# Patient Record
Sex: Female | Born: 2005 | Race: Black or African American | Hispanic: No | Marital: Single | Smoking: Never smoker
Health system: Southern US, Community
[De-identification: ages and names within clinical notes are randomized; demographics above are authoritative.]

## PROBLEM LIST (undated history)

## (undated) DIAGNOSIS — J309 Allergic rhinitis, unspecified: Secondary | ICD-10-CM

## (undated) DIAGNOSIS — E669 Obesity, unspecified: Secondary | ICD-10-CM

## (undated) DIAGNOSIS — I10 Essential (primary) hypertension: Secondary | ICD-10-CM

## (undated) DIAGNOSIS — J45909 Unspecified asthma, uncomplicated: Principal | ICD-10-CM

## (undated) HISTORY — DX: Obesity, unspecified: E66.9

## (undated) HISTORY — PX: TONSILLECTOMY: SUR1361

## (undated) HISTORY — DX: Unspecified asthma, uncomplicated: J45.909

## (undated) HISTORY — PX: TYMPANOSTOMY TUBE PLACEMENT: SHX32

## (undated) HISTORY — DX: Essential (primary) hypertension: I10

## (undated) HISTORY — DX: Allergic rhinitis, unspecified: J30.9

---
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
## 2023-01-24 ENCOUNTER — Ambulatory Visit (INDEPENDENT_AMBULATORY_CARE_PROVIDER_SITE_OTHER): Payer: Self-pay | Admitting: Pediatric Endocrinology

## 2023-01-30 ENCOUNTER — Other Ambulatory Visit (INDEPENDENT_AMBULATORY_CARE_PROVIDER_SITE_OTHER): Payer: Self-pay | Admitting: Pediatric Endocrinology

## 2023-02-04 ENCOUNTER — Emergency Department (HOSPITAL_COMMUNITY)
Admission: EM | Admit: 2023-02-04 | Discharge: 2023-02-04 | Disposition: A | Discharge status: Home / Self Care | Payer: Medicaid Other | Attending: Emergency Medicine | Admitting: Emergency Medicine

## 2023-02-04 ENCOUNTER — Emergency Department (HOSPITAL_COMMUNITY): Payer: Medicaid Other

## 2023-02-04 ENCOUNTER — Other Ambulatory Visit: Payer: Self-pay

## 2023-02-04 DIAGNOSIS — S61211A Laceration without foreign body of left index finger without damage to nail, initial encounter: Secondary | ICD-10-CM | POA: Diagnosis present

## 2023-02-04 DIAGNOSIS — E119 Type 2 diabetes mellitus without complications: Secondary | ICD-10-CM | POA: Insufficient documentation

## 2023-02-04 DIAGNOSIS — Z7984 Long term (current) use of oral hypoglycemic drugs: Secondary | ICD-10-CM | POA: Insufficient documentation

## 2023-02-04 DIAGNOSIS — W268XXA Contact with other sharp object(s), not elsewhere classified, initial encounter: Secondary | ICD-10-CM | POA: Diagnosis not present

## 2023-02-04 DIAGNOSIS — I10 Essential (primary) hypertension: Secondary | ICD-10-CM | POA: Insufficient documentation

## 2023-02-04 DIAGNOSIS — Z794 Long term (current) use of insulin: Secondary | ICD-10-CM | POA: Diagnosis not present

## 2023-02-04 DIAGNOSIS — J45909 Unspecified asthma, uncomplicated: Secondary | ICD-10-CM | POA: Insufficient documentation

## 2023-02-04 DIAGNOSIS — Z23 Encounter for immunization: Secondary | ICD-10-CM | POA: Insufficient documentation

## 2023-02-04 MED ORDER — TETANUS-DIPHTH-ACELL PERTUSSIS 5-2.5-18.5 LF-MCG/0.5 IM SUSY
0.5000 mL | PREFILLED_SYRINGE | Freq: Once | INTRAMUSCULAR | Status: AC
  Administered 2023-02-04: 0.5 mL via INTRAMUSCULAR
  Filled 2023-02-04: qty 0.5

## 2023-02-04 NOTE — ED Triage Notes (Signed)
Pt has a laceration on left hand , first finger. Bleeding under control. Pt was attempting to use a razor blade and it slipped.

## 2023-02-04 NOTE — ED Provider Notes (Signed)
[facility] Provider Note   CSN: [id number] Arrival date & time: 02/04/23  1038     History  Chief Complaint  Patient presents with   Laceration    Tanya Johnson is a 17 y.o. female.   Laceration   17 year old female presents emergency department with complaints of laceration.  Patient states that she recently opened a package that was shipped to her with blades to cut off excess dead skin and when opening a package, suffered a cut to her left index finger.  Denies any weakness or sensory deficits distally.  Is unaware of last tetanus shot but states she had complete series of childhood vaccines.  Past medical history significant for hypertension, obesity, asthma, diabetes mellitus, PCOS  Home Medications Prior to Admission medications   Medication Sig Start Date End Date Taking? Authorizing Provider  acetone, urine, test strip Check ketones per protocol Patient not taking: Reported on 07/22/2020 03/05/20   Lelon Huh, MD  BD PEN NEEDLE MICRO U/F 32G X 6 MM MISC USE TO IINJECT INUSLIN 6 TIMES DAILY. 05/02/22   Lelon Huh, MD  BD PEN NEEDLE NANO 2ND GEN 32G X 4 MM MISC USE TO INJECT INSULIN 6 TIMES DAILY 04/26/21   Lelon Huh, MD  Continuous Blood Gluc Receiver ([place]) DEVI Use with Dexcom Sensor and Transmitter to check Blood Sugars Patient not taking: Reported on 02/21/2021 08/18/20   Lelon Huh, MD  Continuous Blood Gluc Sensor (DEXCOM G6 SENSOR) MISC Remember to change sensor every 10 days. 08/15/21   Lelon Huh, MD  Continuous Blood Gluc Transmit (DEXCOM G6 TRANSMITTER) MISC Use with Dexcom Sensor, reuse transmitter for 3 months. 08/15/21   Lelon Huh, MD  Dulaglutide (TRULICITY) [id number] SOPN Inject 0.75 mg into the skin once a week. Patient not taking: Reported on 10/28/2021 08/15/21   Lelon Huh, MD  fluticasone [facility]) 50 MCG/ACT nasal spray Place 1 spray into both nostrils  daily. Patient not taking: Reported on 07/22/2020 03/25/20   Kyra Leyland, MD  Glucagon [facility] TWO PACK) 3 MG/DOSE POWD Place 1 each into the nose as needed (severe hypoglycmia with unresponsiveness). Patient not taking: Reported on 04/26/2021 03/05/20   Lelon Huh, MD  glucose blood (ACCU-CHEK GUIDE) test strip USE TO CHECK BLOOD SUGAR SIX TIMES DAILY 01/30/23   Lelon Huh, MD  Glucose Blood (IGLUCOSE TEST STRIPS VI) USE AS INSTRUCTED FOR 6 CHECKS DAILY PER PROTOCOL FOR HYPER/HYPOGLYCEMIA 06/18/20   [provider]  insulin aspart (NOVOLOG) 100 UNIT/ML injection Inject up to 200 units into insulin pump as directed every 2-3 days. Please fill for VIAL. Patient not taking: Reported on 10/28/2021 08/29/21   Lelon Huh, MD  Insulin Aspart FlexPen (NOVOLOG) 100 UNIT/ML ADMINISTER UP TO 100 UNITS UNDER THE SKIN EVERY DAY AS DIRECTED BY PRESCRIBER 11/24/22   Lelon Huh, MD  insulin degludec (TRESIBA FLEXTOUCH) 100 UNIT/ML FlexTouch Pen ADMINISTER UP TO 50 UNITS UNDER THE SKIN DAILY AS DIRECTED 10/16/22   Lelon Huh, MD  Insulin Disposable Pump (OMNIPOD 5 G6 INTRO, GEN 5,) KIT Inject 1 Device into the skin as directed. Change pod every 2 days. This will be a 30 day supply. Please fill for [facility] [id number] Patient not taking: Reported on 10/28/2021 08/29/21   Lelon Huh, MD  Insulin Disposable Pump (OMNIPOD 5 G6 POD, GEN 5,) MISC Inject 1 Device into the skin as directed. Change pod every 2 days. Patient will need 3 boxes (each contain 5 pods) for  a 30 day supply. Please fill for [facility] [id number]. 10/28/21   Lelon Huh, MD  insulin glargine (LANTUS SOLOSTAR) 100 UNIT/ML Solostar Pen Inject up to 50 units daily per provider instructions 10/28/21   Lelon Huh, MD  Lancets Misc. ([place]) KIT  03/05/20   [provider]  lisinopril (ZESTRIL) 20 MG tablet Take 1 tablet by mouth daily. 02/27/21   [provider]  loratadine (CLARITIN)  10 MG tablet Take 1 tablet (10 mg total) by mouth daily. Patient not taking: Reported on 07/22/2020 03/25/20   Kyra Leyland, MD  metFORMIN (GLUCOPHAGE-XR) 500 MG 24 hr tablet TAKE 1 TABLET(500 MG) BY MOUTH IN THE MORNING AND AT BEDTIME 09/02/21   Lelon Huh, MD  metFORMIN (GLUCOPHAGE-XR) 750 MG 24 hr tablet Take 750 mg by mouth daily. 10/21/18   [provider]  montelukast (SINGULAIR) 4 MG chewable tablet Chew 1 tablet (4 mg total) by mouth at bedtime. Patient not taking: Reported on 07/22/2020 03/25/20   Kyra Leyland, MD  Ostomy Supplies (SKIN TAC ADHESIVE BARRIER WIPE) MISC For use with Dexcom 02/21/21   Lelon Huh, MD      Allergies    Patient has no known allergies.    Review of Systems   Review of Systems  All other systems reviewed and are negative.   Physical Exam Updated Vital Signs BP (!) 149/78   Pulse 99   Temp 98.6 F (37 C) (Oral)   Resp 17   Ht '5\' 3"'$  (1.6 m)   Wt (!) 141.5 kg   SpO2 100%   BMI 55.27 kg/m  Physical Exam Vitals and nursing note reviewed.  Constitutional:      General: She is not in acute distress.    Appearance: She is well-developed.  HENT:     Head: Normocephalic and atraumatic.  Eyes:     Conjunctiva/sclera: Conjunctivae normal.  Cardiovascular:     Rate and Rhythm: Normal rate and regular rhythm.     Heart sounds: No murmur heard. Pulmonary:     Effort: Pulmonary effort is normal. No respiratory distress.     Breath sounds: Normal breath sounds.  Abdominal:     Palpations: Abdomen is soft.     Tenderness: There is no abdominal tenderness.  Musculoskeletal:        General: No swelling.     Cervical back: Neck supple.     Comments: Patient with full range of motion of first digit.  Approximately 3 cm linear laceration noted on palmar aspect of left index finger crossing distal and phalangeal joint.  Area very superficial in nature.  Active trickling bleeding appreciated when depth of wound observed but able to  tamponade with pressure.  No obvious foreign body appreciated.  Patient complaining no sensory deficit distally.  Patient with full range of motion of said digit in flexion and extension.  Capillary refill less than 2 seconds.  Skin:    General: Skin is warm and dry.     Capillary Refill: Capillary refill takes less than 2 seconds.  Neurological:     Mental Status: She is alert.  Psychiatric:        Mood and Affect: Mood normal.     ED Results / Procedures / Treatments   Labs (all labs ordered are listed, but only abnormal results are displayed) Labs Reviewed - No data to display  EKG None  Radiology DG Hand Complete Left  Result Date: 02/04/2023 CLINICAL DATA:  Left index finger laceration  EXAM: LEFT HAND - COMPLETE 3+ VIEW COMPARISON:  None Available. FINDINGS: There is no evidence of fracture or dislocation. There is no evidence of arthropathy or other focal bone abnormality. Soft tissue swelling and irregularity of the mid to distal aspect of the left index finger with overlying bandaging material. No radiopaque foreign body is seen within the soft tissues. IMPRESSION: Left index finger laceration. No fracture or radiopaque foreign body. Electronically Signed   By: Davina Poke D.O.   On: 02/04/2023 11:08    Procedures Procedures    Medications Ordered in ED Medications  Tdap (BOOSTRIX) injection 0.5 mL (0.5 mLs Intramuscular Given 02/04/23 1126)    ED Course/ Medical Decision Making/ A&P                             Medical Decision Making Amount and/or Complexity of Data Reviewed Radiology: ordered.   This patient presents to the ED for concern of laceration, this involves an extensive number of treatment options, and is a complaint that carries with it a high risk of complications and morbidity.  The differential diagnosis includes laceration, ligamentous/tendinous injury, neurovascular compromise, foreign body retainment   Co morbidities that complicate the  patient evaluation  See HPI   Additional history obtained:  Additional history obtained from EMR External records from outside source obtained and reviewed including hospital records   Lab Tests:  N/a   Imaging Studies ordered:  I ordered imaging studies including left hand x-ray I independently visualized and interpreted imaging which showed laceration appreciated with no obvious foreign body I agree with the radiologist interpretation   Cardiac Monitoring: / EKG:  The patient was maintained on a cardiac monitor.  I personally viewed and interpreted the cardiac monitored which showed an underlying rhythm of: Sinus rhythm   Consultations Obtained:  N/a   Problem List / ED Course / Critical interventions / Medication management  Laceration Reevaluation of the patient showed that the patient stayed the same I have reviewed the patients home medicines and have made adjustments as needed   Social Determinants of Health:  Denies tobacco, illicit drug use.   Test / Admission - Considered:  Laceration Vitals signs significant for mild hypertension with blood pressure 149/78.  Recommend follow-up with primary care regarding elevation blood pressure.. Otherwise within normal range and stable throughout visit. Imaging studies significant for: See above Patient with evidence of left index finger superficial laceration.  Area cleansed and repaired using Dermabond by nursing staff; see their note for more details.  Area well-approximated.  Splint placed afterward.  Patient placed in splint for added protection as well as resistance of flexion of joint given superficial laceration crosses distal interphalangeal joint of affected finger.  No clinical indication for antibiotics at this time.  Patient's tetanus updated while in the emergency department.  Recommend follow-up with primary care for reassessment of symptoms.  Proper wound care with tissue adhesive discussed at length.   Treatment plan discussed at length with patient and she acknowledged understanding was agreeable to said plan. Worrisome signs and symptoms were discussed with the patient, and the patient acknowledged understanding to return to the ED if noticed. Patient was stable upon discharge.          Final Clinical Impression(s) / ED Diagnoses Final diagnoses:  Laceration of left index finger without foreign body without damage to nail, initial encounter    Rx / DC Orders ED Discharge Orders  None         Wilnette Kales, [place] 02/04/23 1129    Noemi Chapel, MD 02/04/23 [phone]

## 2023-02-04 NOTE — Discharge Instructions (Addendum)
Note that your visit to the emergency department today was overall reassuring.  Wound was repaired using tissue adhesive.  Make sure to keep the area free from excess moisture to avoid breakdown of tissue adhesive.  See information attached your discharge papers.  Wear splint as needed for protection as well as to avoid bending affected joint for the next 7 to 10 days.  Recommend reevaluation by primary care for reassessment of your symptoms.  Please do not hesitate to return to emergency department if the worrisome signs and symptoms we discussed become apparent.

## 2023-03-08 ENCOUNTER — Other Ambulatory Visit (INDEPENDENT_AMBULATORY_CARE_PROVIDER_SITE_OTHER): Payer: Self-pay | Admitting: Pediatric Endocrinology

## 2023-03-26 ENCOUNTER — Encounter (INDEPENDENT_AMBULATORY_CARE_PROVIDER_SITE_OTHER): Payer: Self-pay | Admitting: Pediatric Endocrinology

## 2023-03-26 ENCOUNTER — Telehealth (INDEPENDENT_AMBULATORY_CARE_PROVIDER_SITE_OTHER): Payer: Medicaid Other | Admitting: Pediatric Endocrinology

## 2023-03-26 VITALS — Ht 63.0 in | Wt 316.0 lb

## 2023-03-26 DIAGNOSIS — E1165 Type 2 diabetes mellitus with hyperglycemia: Secondary | ICD-10-CM

## 2023-03-26 DIAGNOSIS — E1065 Type 1 diabetes mellitus with hyperglycemia: Secondary | ICD-10-CM | POA: Diagnosis not present

## 2023-03-26 MED ORDER — ACCU-CHEK FASTCLIX LANCET KIT: PACK | 1 refills | Status: AC

## 2023-03-26 MED ORDER — ACCU-CHEK FASTCLIX LANCETS MISC: 3 refills | Status: DC

## 2023-03-26 NOTE — Patient Instructions (Signed)
Next Monday- IN PERSON with KELLY to get your G7 sample. Please have labs drawn when you are in clinic.   2-3 weeks with Dr. Ladona Ridgel to review the [facility] and do the PA. - Virtual   6 weeks with Dr. Vanessa Alorton- Virtual

## 2023-03-26 NOTE — Progress Notes (Signed)
This is a Pediatric Specialist E-Visit consult/follow up provided via My Chart Video Visit (Caregility). Tanya Johnson   consented to an E-Visit consult today.  Is the patient present for the video visit? Yes Location of patient: Tanya Johnson is at home in [place], [place] Is the patient located in the state of [place]? Yes If not in the state of [place], is the location temporary? Ex. vacation or at college? Not Applicable Location of provider: Dessa Phi, MD is at  [facility] Patient was referred by Richrd Sox, MD   The following participants were involved in this E-Visit: Leanna Sato, pt, Danella Maiers, CMA, Dessa Phi, MD  This visit was done via VIDEO   Chief Complain/ Reason for E-Visit today: diabetes Total time on call: 32 min Follow up: 2-3 weeks with Dr. Ladona Ridgel to review the [facility] and do the PA. - Virtual   6 weeks with Dr. Vanessa [place]- Virtual     Subjective:  Subjective  Patient Name: Tanya Johnson Date of Birth: 2006-06-29  MRN: [id number]  Tanya Johnson  presents to the office today for follow-up evaluation and management of her type 1 diabetes and severe pediatric obesity.   HISTORY OF PRESENT ILLNESS:   Tanya Johnson is a 17 y.o. AA female   Tanya Johnson was accompanied by her mom  1. Tanya Johnson was previously followed in pediatric endocrine clinic. She was lost to follow up from spring 2020 to to spring 2021 due to the Covid 19 pandemic. During that year she developed overt diabetes. She was admitted to [facility] on 03/04/20 for initiation of subcutaneous insulin. 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   2. The patient's last [facility] visit was on 08/15/21. In the interim she has been doing ok.   She wishes that she was on her OmniPod but she has missed too many appointments. She is checking her sugar about 1-2 times a day. She is taking insulin after she eats.   She is taking "no more than  10" units at a meal. She is adding up her carbs and using her chart to calculate doses.   She would like to go back onto the [facility]. She was previously on G6 and would like to upgrade to G7.    Insulin:  Tresiba 26 units Novolog 120/30/5 Metformin 1000 mg twice a day- has not been taking this. Does not feel that she needs this.   She feels that her weight is dropping "slowly but surely"   3. Pertinent Review of Systems:  Constitutional: The patient feels "good". The patient seems healthy and active. Eyes: Vision seems to be good. There are no recognized eye problems.  Last eye exam November 2023- got new glasses.  Neck: The patient has no complaints of anterior neck swelling, soreness, tenderness, pressure, discomfort, or difficulty swallowing.   Heart: Heart rate increases with exercise or other physical activity. The patient has no complaints of palpitations, irregular heart beats, chest pain, or chest pressure.   Lungs: Started on Montelukast for her asthma. Last bad asthma when "younger" Gastrointestinal: Bowel movents seem normal. The patient has no complaints of excessive hunger, acid reflux, upset stomach, stomach aches or pains, diarrhea, or constipation.  Legs: Muscle mass and strength seem normal. There are no complaints of numbness, tingling, burning, or pain. No edema is noted.  Feet: There are no obvious foot problems. There are no complaints of numbness, tingling, burning, or pain. No edema is noted. Neurologic: There  are no recognized problems with muscle movement and strength, sensation, or coordination. GYN/GU:  LMP 4/13   Diabetes Alert: none  Annual labs due May 2022 - had BMP at [facility] in April. - WILL DO NEXT WEEK Hypoglycemia- No severe lows recently.  Blood sugar download:  7 day avg 224. 14 day avg 263 (13 checks over 14 days).     PAST MEDICAL, FAMILY, AND SOCIAL HISTORY  Past Medical History:  Diagnosis Date   Abnormal food appetite 02/21/2017    Allergic rhinitis 09/05/2013   Elevated transaminase level 03/04/2020   Hypertension    Obesity, unspecified 09/05/2013   Prediabetes 03/27/2014   Hgb A1C 5.9%     Unspecified asthma(493.90) 09/05/2013    Family History  Problem Relation Age of Onset   Diabetes Mother    Hypertension Mother    Asthma Sister    Obesity Paternal Aunt    Obesity Paternal Grandmother    Diabetes Paternal Grandmother    Hypertension Paternal Grandmother    Diabetes Paternal Grandfather    Heart disease Paternal Grandfather    Hypertension Paternal Grandfather    Obesity Paternal Grandfather    Bronchitis Sister      Current Outpatient Medications:    Accu-Chek FastClix Lancets MISC, Check sugar up to 6 times daily. For use with FAST CLIX Lancet Device, Disp: 204 each, Rfl: 3   acetone, urine, test strip, Check ketones per protocol, Disp: 50 each, Rfl: 3   BD PEN NEEDLE MICRO U/F 32G X 6 MM MISC, USE TO IINJECT INUSLIN 6 TIMES DAILY., Disp: 200 each, Rfl: 5   BD PEN NEEDLE NANO 2ND GEN 32G X 4 MM MISC, USE TO INJECT INSULIN 6 TIMES DAILY, Disp: 200 each, Rfl: 5   glucose blood (ACCU-CHEK GUIDE) test strip, USE TO CHECK BLOOD SUGAR SIX TIMES DAILY, Disp: 200 strip, Rfl: 5   Glucose Blood (IGLUCOSE TEST STRIPS VI), USE AS INSTRUCTED FOR 6 CHECKS DAILY PER PROTOCOL FOR HYPER/HYPOGLYCEMIA, Disp: , Rfl:    Insulin Aspart FlexPen (NOVOLOG) 100 UNIT/ML, ADMINISTER UP TO 100 UNITS UNDER THE SKIN EVERY DAY AS DIRECTED BY PRESCRIBER, Disp: 30 mL, Rfl: 5   insulin degludec (TRESIBA FLEXTOUCH) 100 UNIT/ML FlexTouch Pen, ADMINISTER UP TO 50 UNITS UNDER THE SKIN DAILY AS DIRECTED, Disp: 15 mL, Rfl: 5   Lancets Misc. (ACCU-CHEK FASTCLIX LANCET) KIT, Check sugar 6 times daily, Disp: 1 kit, Rfl: 1   lisinopril (ZESTRIL) 20 MG tablet, Take 1 tablet by mouth daily., Disp: , Rfl:    Continuous Blood Gluc Receiver (DEXCOM G6 RECEIVER) DEVI, Use with Dexcom Sensor and Transmitter to check Blood Sugars (Patient not taking:  Reported on 02/21/2021), Disp: 1 each, Rfl: 0   Continuous Blood Gluc Sensor (DEXCOM G6 SENSOR) MISC, Remember to change sensor every 10 days. (Patient not taking: Reported on 03/26/2023), Disp: 3 each, Rfl: 5   Continuous Blood Gluc Transmit (DEXCOM G6 TRANSMITTER) MISC, Use with Dexcom Sensor, reuse transmitter for 3 months. (Patient not taking: Reported on 03/26/2023), Disp: 1 each, Rfl: 1   Dulaglutide (TRULICITY) 0.75 MG/0.5ML SOPN, Inject 0.75 mg into the skin once a week. (Patient not taking: Reported on 10/28/2021), Disp: 2 mL, Rfl: 3   fluticasone (FLONASE) 50 MCG/ACT nasal spray, Place 1 spray into both nostrils daily. (Patient not taking: Reported on 07/22/2020), Disp: 16 g, Rfl: 12   Glucagon (BAQSIMI TWO PACK) 3 MG/DOSE POWD, Place 1 each into the nose as needed (severe hypoglycmia with unresponsiveness). (Patient not taking: Reported  on 04/26/2021), Disp: 1 each, Rfl: 3   insulin aspart (NOVOLOG) 100 UNIT/ML injection, Inject up to 200 units into insulin pump as directed every 2-3 days. Please fill for VIAL. (Patient not taking: Reported on 10/28/2021), Disp: 30 mL, Rfl: 5   Insulin Disposable Pump (OMNIPOD 5 G6 INTRO, GEN 5,) KIT, Inject 1 Device into the skin as directed. Change pod every 2 days. This will be a 30 day supply. Please fill for [facility] [id number] (Patient not taking: Reported on 10/28/2021), Disp: 1 kit, Rfl: 1   Insulin Disposable Pump (OMNIPOD 5 G6 POD, GEN 5,) MISC, Inject 1 Device into the skin as directed. Change pod every 2 days. Patient will need 3 boxes (each contain 5 pods) for a 30 day supply. Please fill for [facility] [address]-21. (Patient not taking: Reported on 03/26/2023), Disp: 15 each, Rfl: 4   insulin glargine (LANTUS SOLOSTAR) 100 UNIT/ML Solostar Pen, Inject up to 50 units daily per provider instructions (Patient not taking: Reported on 03/26/2023), Disp: 15 mL, Rfl: 6   lisinopril (ZESTRIL) 20 MG tablet, Take by mouth., Disp: , Rfl:    loratadine (CLARITIN) 10 MG tablet,  Take 1 tablet (10 mg total) by mouth daily. (Patient not taking: Reported on 07/22/2020), Disp: 30 tablet, Rfl: 11   metFORMIN (GLUCOPHAGE-XR) 500 MG 24 hr tablet, TAKE 1 TABLET(500 MG) BY MOUTH IN THE MORNING AND AT BEDTIME (Patient not taking: Reported on 03/26/2023), Disp: 60 tablet, Rfl: 5   metFORMIN (GLUCOPHAGE-XR) 750 MG 24 hr tablet, Take 750 mg by mouth daily. (Patient not taking: Reported on 03/26/2023), Disp: , Rfl:    montelukast (SINGULAIR) 4 MG chewable tablet, Chew 1 tablet (4 mg total) by mouth at bedtime. (Patient not taking: Reported on 07/22/2020), Disp: 30 tablet, Rfl: 6   Ostomy Supplies (SKIN TAC ADHESIVE BARRIER WIPE) MISC, For use with Dexcom (Patient not taking: Reported on 03/26/2023), Disp: 50 each, Rfl: 1  Allergies as of 03/26/2023   (No Known Allergies)     reports that she has never smoked. She has been exposed to tobacco smoke. She has never used smokeless tobacco. She reports that she does not currently use drugs after having used the following drugs: Marijuana. She reports that she does not drink alcohol. Pediatric History  Patient Parents   Baswell,Junious (Father)   Sport and exercise psychologist (Mother)   Other Topics Concern   Not on file  Social History Narrative   Lives with dad, PGF and PGM and Paternal Aunt, brother age 15 year old   Goes to [employer] on weekends. Sisters 3 and 2 live with mom      2018 lives with mom and stepdad,and siblings  mom smokes   Visits dad on weekends.      She is in 9th grade       she gets A's and B's. She enjoys playing outside, listening to music, and drawing.       2021: stays with grandmother most of the time, but also sometimes stays with mom      2022: 10th grade at [employer].       2024[employer] 12th grade 23-24 school year    1. School and Family: 12th grade at [employer] 2. Activities:  [employer] 3. Primary Care Provider: Lucio Edward, MD  ROS: There are no other significant problems  involving Kadian's other body systems.    Objective:  Objective  Vital Signs:   Ht 5\' 3"  (1.6 m) Comment: at heart  dr February 12 2023  Wt (!) 316 lb (143.3 kg) Comment: at heart dr February 12 2023  BMI 55.98 kg/m   Blood pressure %iles are not available for patients who are 18 years or older.  Ht Readings from Last 3 Encounters:  03/27/23 5\' 3"  (1.6 m) (31 %, Z= -0.50)*  03/26/23 5\' 3"  (1.6 m) (31 %, Z= -0.50)*  02/04/23 5\' 3"  (1.6 m) (31 %, Z= -0.49)*   * Growth percentiles are based on CDC (Girls, 2-20 Years) data.   Wt Readings from Last 3 Encounters:  04/02/23 (!) 316 lb 12.8 oz (143.7 kg) (>99 %, Z= 2.83)*  03/27/23 (!) 314 lb (142.4 kg) (>99 %, Z= 2.81)*  03/26/23 (!) 316 lb (143.3 kg) (>99 %, Z= 2.82)*   * Growth percentiles are based on CDC (Girls, 2-20 Years) data.   HC Readings from Last 3 Encounters:  No data found for [facility]   Body surface area is 2.52 meters squared. 31 %ile (Z= -0.50) based on CDC (Girls, 2-20 Years) Stature-for-age data based on Stature recorded on 03/26/2023. >99 %ile (Z= 2.82) based on CDC (Girls, 2-20 Years) weight-for-age data using vitals from 03/26/2023.  PHYSICAL EXAM:  Physical Exam Constitutional:      Appearance: Normal appearance. She is obese.  HENT:     Head: Normocephalic.     Right Ear: External ear normal.     Left Ear: External ear normal.     Nose: Nose normal.     Comments: Bilateral piercings    Mouth/Throat:     Mouth: Mucous membranes are moist.  Eyes:     Extraocular Movements: Extraocular movements intact.  Pulmonary:     Effort: Pulmonary effort is normal.  Musculoskeletal:        General: Normal range of motion.     Cervical back: Normal range of motion.  Skin:    General: Skin is warm and dry.  Neurological:     General: No focal deficit present.     Mental Status: She is alert.  Psychiatric:        Mood and Affect: Mood normal.        Behavior: Behavior normal.     LAB DATA:    Lab Results  Component  Value Date   HGBA1C 8.7 (A) 08/15/2021   HGBA1C 11.0 (A) 02/21/2021   HGBA1C 7.5 (A) 07/22/2020   HGBA1C 9.9 (H) 03/04/2020   HGBA1C 10.1 (A) 03/04/2020   HGBA1C 6.3 (A) 01/22/2019   HGBA1C 6.4 (H) 09/27/2018   HGBA1C 6.2 01/09/2018      Results for orders placed or performed in visit on 03/26/23  Comprehensive metabolic panel  Result Value Ref Range   Glucose, Bld 292 (H) 65 - 99 mg/dL   BUN 12 7 - 20 mg/dL   Creat 1.19 1.47 - 8.29 mg/dL   BUN/Creatinine Ratio SEE NOTE: 6 - 22 (calc)   Sodium 137 135 - 146 mmol/L   Potassium 5.3 (H) 3.8 - 5.1 mmol/L   Chloride 102 98 - 110 mmol/L   CO2 27 20 - 32 mmol/L   Calcium 9.2 8.9 - 10.4 mg/dL   Total Protein 6.8 6.3 - 8.2 g/dL   Albumin 3.8 3.6 - 5.1 g/dL   Globulin 3.0 2.0 - 3.8 g/dL (calc)   AG Ratio 1.3 1.0 - 2.5 (calc)   Total Bilirubin 0.3 0.2 - 1.1 mg/dL   Alkaline phosphatase (APISO) 72 36 - 128 U/L   AST 14 12 - 32 U/L  ALT 20 5 - 32 U/L  TSH  Result Value Ref Range   TSH 1.51 mIU/L  T4, free  Result Value Ref Range   Free T4 1.2 0.8 - 1.4 ng/dL  C-peptide  Result Value Ref Range   C-Peptide 1.96 0.80 - 3.85 ng/mL  Lipid panel  Result Value Ref Range   Cholesterol 216 (H) <170 mg/dL   HDL 46 >40 mg/dL   Triglycerides 97 (H) <90 mg/dL   LDL Cholesterol (Calc) 149 (H) <110 mg/dL (calc)   Total CHOL/HDL Ratio 4.7 <5.0 (calc)   Non-HDL Cholesterol (Calc) 170 (H) <120 mg/dL (calc)       Assessment and Plan:  Assessment  ASSESSMENT: Avagrace is a 17 y.o. AA female with new onset diabetes.    Diabetes, uncontrolled, new onset, type 1 (GAD antibody positive) with insulin resistance (high c-peptide and acanthosis)  - She has struggled with being consistent with coming to visits and this has caused her to lose the authorization for her pump and CGM - She is currently taking injections of Tresiba and novolog- but she has been missing some insulin - Discussed that she needs to come to the office next week for labs  and Dexcom G7 training - Will plan to restart CGM at that time and will adjust insulin doses based on that data - Will need to focus on consistency with her insulin doses.   PLAN:   1. Diagnostic: Lab Orders         Comprehensive metabolic panel         TSH         T4, free         C-peptide         Lipid panel         Microalbumin / creatinine urine ratio     2. Therapeutic: Continue Novolog 120/30/5. Continue Tresiba 26 units.  3. Patient education: Discussion of the above.  4. Follow-up: Return in about 6 weeks (around 05/07/2023).  Next week with Tresa Endo for G7 training. 2-3 weeks with Dr. Ladona Ridgel for review of Dexcom data and insulin dose adjustment.   Dessa Phi, MD   LOS >40 minutes spent today reviewing the medical chart, counseling the patient/family, and documenting today's encounter. When a patient is on insulin, intensive monitoring of blood glucose levels is necessary to avoid hyperglycemia and hypoglycemia. Severe hyperglycemia/hypoglycemia can lead to hospital admissions and be life threatening.    Patient referred by Richrd Sox, MD for new onset diabetes  Copy of this note sent to Lucio Edward, MD

## 2023-03-27 ENCOUNTER — Encounter: Payer: Self-pay | Admitting: Pediatrics

## 2023-03-27 ENCOUNTER — Ambulatory Visit: Payer: Medicaid Other | Admitting: Pediatrics

## 2023-03-27 VITALS — BP 120/76 | Ht 63.0 in | Wt 314.0 lb

## 2023-03-27 DIAGNOSIS — Z0000 Encounter for general adult medical examination without abnormal findings: Secondary | ICD-10-CM | POA: Diagnosis not present

## 2023-03-27 DIAGNOSIS — Z113 Encounter for screening for infections with a predominantly sexual mode of transmission: Secondary | ICD-10-CM

## 2023-03-27 DIAGNOSIS — Z0001 Encounter for general adult medical examination with abnormal findings: Secondary | ICD-10-CM

## 2023-03-28 LAB — C. TRACHOMATIS/N. GONORRHOEAE RNA
C. trachomatis RNA, TMA: NOT DETECTED
N. gonorrhoeae RNA, TMA: NOT DETECTED

## 2023-04-02 ENCOUNTER — Ambulatory Visit (INDEPENDENT_AMBULATORY_CARE_PROVIDER_SITE_OTHER): Payer: Self-pay

## 2023-04-02 ENCOUNTER — Ambulatory Visit (INDEPENDENT_AMBULATORY_CARE_PROVIDER_SITE_OTHER): Payer: Medicaid Other | Admitting: Pediatric Endocrinology

## 2023-04-02 VITALS — HR 80 | Wt 316.8 lb

## 2023-04-02 DIAGNOSIS — E1065 Type 1 diabetes mellitus with hyperglycemia: Secondary | ICD-10-CM

## 2023-04-02 NOTE — Progress Notes (Signed)
Dexcom G7 patient education Person(s)instructed: patient  Instruction: Patient oriented to three components of Dexcom G7 continuous glucose monitor (sensor, receiver/cellphone) Receiver or cellphone: cell phone -Dexcom G7 AND dexcom clarity app downloaded onto cellphone:yes   -Patient educated that Dexom G7 app must always be running (patient should not close out of app) -If using Dexcom G7 app, patient may share blood glucose data with up to 10 followers on dexcom follow app.  CGM overview and set-up  1. Button, touch screen, and icons 2. Power supply and recharging 3. Home screen 4. Date and time 5. Set BG target range 6. Set alarm/alert tone: -Low alert: 80 mg/dL -High alert: 161 mg/dL > 3 hours 7. Interstitial vs. capillary blood glucose readings  8. When to verify sensor reading with fingerstick blood glucose 9. Blood glucose reading measured every five minutes. 10. Sensor will last 10 days 11.Sensor  must be within 20 feet of receiver/cell phone.  Sensor application -- sensor placed on Right stomach 1. Site selection and site prep with alcohol pad 2. Sensor prep-sensor pack and sensor applicator 3. Sensor applied to area away from waistband, scarring, tattoos, irritation, and bones 4. Starting the sensor: 30 minute warm up before BG readings available 5. Sensor change every 10 days and rotate site 6. Call Dexcom customer service if sensor comes off before 10 days  Safety and Troubleshooting 1. Do a fingerstick blood glucose test if the sensor readings do not match how    you feel 2. Remove sensor prior to magnetic resonance imaging (MRI), computed tomography (CT) scan, or high-frequency electrical heat (diathermy) treatment. 3. Do not allow sun screen or insect repellant to come into contact with Dexcom G7. These skin care products may lead for the plastic used in the Dexcom G7 to crack. 4. Dexcom G7 may be worn through a Industrial/product designer. It may not be exposed  to an advanced Imaging Technology (AIT) body scanner (also called a millimeter wave scanner) or the baggage x-ray machine. Instead, ask for hand-wanding or full-body pat-down and visual inspection.   Contact information provided for [facility] customer service and/or trainer.   Provider available for questions.   I have reviewed the following documentation and I am in agreement.  I was immediately available to the nurse for questions and collaboration.  Dessa Phi, MD

## 2023-04-03 LAB — COMPREHENSIVE METABOLIC PANEL
AG Ratio: 1.3 (calc) (ref 1.0–2.5)
ALT: 20 U/L (ref 5–32)
AST: 14 U/L (ref 12–32)
Albumin: 3.8 g/dL (ref 3.6–5.1)
Alkaline phosphatase (APISO): 72 U/L (ref 36–128)
BUN: 12 mg/dL (ref 7–20)
CO2: 27 mmol/L (ref 20–32)
Calcium: 9.2 mg/dL (ref 8.9–10.4)
Chloride: 102 mmol/L (ref 98–110)
Creat: 0.78 mg/dL (ref 0.50–0.96)
Globulin: 3 g/dL (calc) (ref 2.0–3.8)
Glucose, Bld: 292 mg/dL — ABNORMAL HIGH (ref 65–99)
Potassium: 5.3 mmol/L — ABNORMAL HIGH (ref 3.8–5.1)
Sodium: 137 mmol/L (ref 135–146)
Total Bilirubin: 0.3 mg/dL (ref 0.2–1.1)
Total Protein: 6.8 g/dL (ref 6.3–8.2)

## 2023-04-03 LAB — LIPID PANEL
Cholesterol: 216 mg/dL — ABNORMAL HIGH (ref <170)
HDL: 46 mg/dL (ref >45)
LDL Cholesterol (Calc): 149 mg/dL (calc) — ABNORMAL HIGH (ref <110)
Non-HDL Cholesterol (Calc): 170 mg/dL (calc) — ABNORMAL HIGH (ref <120)
Total CHOL/HDL Ratio: 4.7 (calc) (ref <5.0)
Triglycerides: 97 mg/dL — ABNORMAL HIGH (ref <90)

## 2023-04-03 LAB — TSH: TSH: 1.51 mIU/L

## 2023-04-03 LAB — C-PEPTIDE: C-Peptide: 1.96 ng/mL (ref 0.80–3.85)

## 2023-04-03 LAB — T4, FREE: Free T4: 1.2 ng/dL (ref 0.8–1.4)

## 2023-04-03 NOTE — Progress Notes (Signed)
Adolescent Well Care Visit Tanya Johnson is a 17 y.o. female who is here for well care.    PCP:  Lucio Edward, MD   History was provided by the patient.  Confidentiality was discussed with the patient and, if applicable, with caregiver as well. Patient's personal or confidential phone number:    Current Issues: Current concerns include none.   Nutrition: Nutrition/Eating Behaviors: Varied diet, including meats, fruits and vegetables.  Watches for carbohydrates in regards to her diabetes.  States that she is between type I and type II diabetic.  She does not quite understand what this means.  States her sugars are fairly well-controlled.  Hemoglobin A1c is less than 7.  Supposed to have blood work performed next week in the office.  Followed by cardiology for hypertension.  Placed on lisinopril 20 mg. Adequate calcium in diet?:  Yes Supplements/ Vitamins: No  Exercise/ Media: Play any Sports?/ Exercise: Yes Screen Time:  > 2 hours-counseling provided Media Rules or Monitoring?: no  Sleep:  Sleep: 6 to 7 hours  Social Screening: Lives with: Mother Parental relations:  good Activities, Work, and Regulatory affairs officer?:  Yes Concerns regarding behavior with peers?  no Stressors of note: no  Education: School Name: [employer] School Grade: Camera operator: doing well; no concerns School Behavior: doing well; no concerns  Menstruation:   No LMP recorded. Menstrual History: Regular  Confidential Social History: Tobacco?  No Secondhand smoke exposure?  No Drugs/ETOH?  No  Sexually Active?  No Pregnancy Prevention: Not applicable  Safe at home, in school & in relationships?  Yes Safe to self?  Yes  Screenings: Patient has a dental home: Yes   PHQ-9 completed and results indicated: Patient would like to see a therapist.  She states that she Does have some anxiety and concerns in regards to her diabetes and other issues. Vitals:   03/27/23 1004  BP: 120/76   Weight: (!) 314 lb (142.4 kg)  Height: 5\' 3"  (1.6 m)   BP 120/76   Ht 5\' 3"  (1.6 m)   Wt (!) 314 lb (142.4 kg)   BMI 55.62 kg/m  Body mass index: body mass index is 55.62 kg/m. Blood pressure %iles are not available for patients who are 18 years or older.  Hearing Screening   500Hz  1000Hz  2000Hz  3000Hz  4000Hz   Right ear 25 20 20 20 20   Left ear 25 20 20 20 20    Vision Screening   Right eye Left eye Both eyes  Without correction     With correction 20/20 20/20 20/20     General Appearance:   alert, oriented, no acute distress, well nourished, and obese  HENT: Normocephalic, no obvious abnormality, conjunctiva clear  Mouth:   Normal appearing teeth, no obvious discoloration, dental caries, or dental caps  Neck:   Supple; thyroid: no enlargement, symmetric, no tenderness/mass/nodules  Chest Normal female, CMA present during examination  Lungs:   Clear to auscultation bilaterally, normal work of breathing  Heart:   Regular rate and rhythm, S1 and S2 normal, no murmurs;   Abdomen:   Soft, non-tender, no mass, or organomegaly  GU Not examined  Musculoskeletal:   Tone and strength strong and symmetrical, all extremities               Lymphatic:   No cervical adenopathy  Skin/Hair/Nails:   Skin warm, dry and intact, no rashes, no bruises or petechiae, acanthosis nigricans.  Neurologic:   Strength, gait, and coordination normal and age-appropriate  Assessment and Plan:   1.  Well-child check 2.  Will ask endocrinology to refer the patient to therapist as they have specific therapist to help their patients to deal with diabetes and other issues. 3.  Continue to follow-up with endocrinology and cardiology. 4.  States that she has had her eyes checked and is due for blood work and urine check next week.  Just had a e-visit with endocrinology.  BMI is not appropriate for age  Hearing screening result:normal Vision screening result: normal  Counseling provided for all of the  vaccine components  Orders Placed This Encounter  Procedures   C. trachomatis/N. gonorrhoeae RNA     No follow-ups on file.Lucio Edward, MD

## 2023-04-04 ENCOUNTER — Encounter (INDEPENDENT_AMBULATORY_CARE_PROVIDER_SITE_OTHER): Payer: Self-pay | Admitting: Pediatric Endocrinology

## 2023-04-06 ENCOUNTER — Telehealth (INDEPENDENT_AMBULATORY_CARE_PROVIDER_SITE_OTHER): Payer: Self-pay | Admitting: Pediatric Endocrinology

## 2023-04-06 NOTE — Telephone Encounter (Signed)
  Name of who is calling:Kayal   Caller's Relationship to Patient:self   Best contact [phone]  Provider they see:Dr.Badik   Reason for call:Naiara called in leaving a VM requesting a call back to go over test results. Please advise      PRESCRIPTION REFILL ONLY  Name of prescription:  Pharmacy:

## 2023-04-08 ENCOUNTER — Other Ambulatory Visit (INDEPENDENT_AMBULATORY_CARE_PROVIDER_SITE_OTHER): Payer: Self-pay | Admitting: Pediatric Endocrinology

## 2023-04-08 DIAGNOSIS — F4323 Adjustment disorder with mixed anxiety and depressed mood: Secondary | ICD-10-CM

## 2023-04-10 ENCOUNTER — Other Ambulatory Visit (INDEPENDENT_AMBULATORY_CARE_PROVIDER_SITE_OTHER): Payer: Self-pay | Admitting: Pediatric Endocrinology

## 2023-04-11 ENCOUNTER — Encounter (INDEPENDENT_AMBULATORY_CARE_PROVIDER_SITE_OTHER): Payer: Self-pay | Admitting: Pediatric Endocrinology

## 2023-04-11 NOTE — Telephone Encounter (Signed)
Returned call to patient and left VM to call back or send MyChart message with questions.   Dessa Phi, MD

## 2023-04-13 ENCOUNTER — Telehealth (INDEPENDENT_AMBULATORY_CARE_PROVIDER_SITE_OTHER): Payer: Self-pay | Admitting: Pharmacist

## 2023-04-13 ENCOUNTER — Telehealth (INDEPENDENT_AMBULATORY_CARE_PROVIDER_SITE_OTHER): Payer: Self-pay | Admitting: Pediatric Endocrinology

## 2023-04-13 DIAGNOSIS — E1065 Type 1 diabetes mellitus with hyperglycemia: Secondary | ICD-10-CM

## 2023-04-13 MED ORDER — DEXCOM G6 SENSOR MISC: 5 refills | Status: DC

## 2023-04-13 NOTE — Telephone Encounter (Signed)
A user error has taken place: encounter opened in error, closed for administrative reasons.

## 2023-04-13 NOTE — Telephone Encounter (Signed)
  Name of who is calling: Alize   Caller's Relationship to Patient:   Best contact number: [phone]  Provider they see: Vanessa [place]  Reason for call: Demple stated that her dexcom has run out and she needs a refill for it.      PRESCRIPTION REFILL ONLY  Name of prescription: Dexcom G6  Pharmacy: [facility] [address]

## 2023-04-16 ENCOUNTER — Encounter (INDEPENDENT_AMBULATORY_CARE_PROVIDER_SITE_OTHER): Payer: Self-pay | Admitting: Pharmacist

## 2023-04-16 ENCOUNTER — Telehealth (INDEPENDENT_AMBULATORY_CARE_PROVIDER_SITE_OTHER): Payer: Medicaid Other | Admitting: Pharmacist

## 2023-04-16 ENCOUNTER — Telehealth (INDEPENDENT_AMBULATORY_CARE_PROVIDER_SITE_OTHER): Payer: Self-pay

## 2023-04-16 ENCOUNTER — Encounter (INDEPENDENT_AMBULATORY_CARE_PROVIDER_SITE_OTHER): Payer: Self-pay

## 2023-04-16 DIAGNOSIS — E1065 Type 1 diabetes mellitus with hyperglycemia: Secondary | ICD-10-CM

## 2023-04-16 MED ORDER — DEXCOM G6 SENSOR MISC: 5 refills | Status: DC

## 2023-04-16 NOTE — Telephone Encounter (Signed)
Fax from [employer] states Pt Dexcom G6 Sensors APPROVED THRU 10/13/2023

## 2023-04-16 NOTE — Addendum Note (Signed)
Addended by: Pollie Friar D on: 04/16/2023 02:45 PM   Modules accepted: Orders

## 2023-04-16 NOTE — Progress Notes (Signed)
This is a Pediatric Specialist E-Visit (My Chart Video Visit) follow up consult provided via Caregility Tanya Johnson consented to an E-Visit consult today Location of patient: Tanya Johnson is at home  Location of provider: Zachery Conch, PharmD, BCACP, CDCES, CPP is working remotely   S:     Chief Complaint  Patient presents with   Diabetes    Medication Management Follow Up     Endocrinology provider: Dessa Phi, MD  Patient referred to me for diabetes management. PMH significant for T1DM (dx 03/04/20; 10.1% (03/04/20 15:32) --> 9.9% (03/04/20 20:01), insulin ab negative, GAD65 ab positive (5161.1 U/mL), pancreatic islet cell ab elevated (2.5 ng/mL), c-peptide elevated (2.5 ng/mL), no ZnT8 ab or IA-2 ab), insulin resistance, HTN, allergic rhinitis, PCOS, goiter, hyperandrogenism. Of note, patient previously established care with Dr. Vanessa Deerfield on 02/21/2017 as she had been referred by her primary care provider due to weight gain and A1c of 6.2%; she was being managed for prediabetes (managed with lifestyle changes and use of metformin) but then patient was lost to follow up from Spring 2020 to to Spring 2021 due to the COVID-19 pandemic. Patient manages DM via multiple daily insulin injections and by wearing a Dexcom G6 continuous glucose monitor (CGM).  I connected with Tanya Johnson on 04/16/23 by video and verified that I am speaking with the correct person using two identifiers. She reports it is challenging to carb count if the food item does not contain a nutrition facts label.   School: virtual  -Grade level: 12th  Insurance Coverage: [place] Managed Medicaid ([employer])  Preferred Pharmacy [facility] [phone] - [place], [place] - [address], [place] [address] Phone: [phone]  Fax: [phone] DEA #: [id number]   Diabetes Medication Regimen  -Basal insulin: Evaristo Bury Flextouch pen (100  units/mL; 1 unit increments) 26 units daily -Bolus insulin: Novolog Flexpen disposable pen (1 unit increments) (using dosing charts) --ICR: 5 --ISF: 30 --Target BG: 120 mg/dL (day) and 132 mg/dL (bed) -Other diabetes medication(s): Trulicity (insurance stopped covering), metformin (GI upset, "stopped reading based on what she saw on the internet")  Diet: Patient/guardian reports patient's dietary habits:  Breakfast (9-10am): 10-11 units Lunch (2-3pm): 10-11 units Dinner (6-7pm): 10-11 units Snacks (between lunch and dinner (3-4pm): sometimes gives insulin (around 15-20 grams of carb) Drinks: water, diet sprite, diet dr pepper, juice (sugar juice; 1-2x each week)  Exercise: Patient/guardian reports patient's exercise habits: none   O:   Labs:   Dexcom Clarity Report    There were no vitals filed for this visit.  HbA1c Lab Results  Component Value Date   HGBA1C 8.7 (A) 08/15/2021   HGBA1C 11.0 (A) 02/21/2021   HGBA1C 7.5 (A) 07/22/2020    Pancreatic Islet Cell Autoantibodies Lab Results  Component Value Date   ISLETAB Negative 03/04/2020    Insulin Autoantibodies Lab Results  Component Value Date   INSULINAB <5.0 03/04/2020    Glutamic Acid Decarboxylase Autoantibodies Lab Results  Component Value Date   GLUTAMICACAB >250 (H) 02/21/2021    ZnT8 Autoantibodies No results found for: "ZNT8AB"  IA-2 Autoantibodies No results found for: "LABIA2"  C-Peptide Lab Results  Component Value Date   CPEPTIDE 1.96 04/02/2023    Microalbumin Lab Results  Component Value Date   MICRALBCREAT 7 01/09/2018    Lipids    Component Value Date/Time   CHOL 216 (H) 04/02/2023 1127  CHOL 211 (H) 09/27/2018 1430   TRIG 97 (H) 04/02/2023 1127   HDL 46 04/02/2023 1127   HDL 37 (L) 09/27/2018 1430   CHOLHDL 4.7 04/02/2023 1127   LDLCALC 149 (H) 04/02/2023 1127    Assessment: TIR is not at goal > 70%. Hypoglycemia tends to occur overnight likely due to bedtime  correction dose; will change bedtime Novolog dosing. Patient has had issues with carb counting; we reviewed how to carb count without nutrition facts (reviewed common carbs without nutrition facts (pasta, rice, starchy vegetables, fruit) and reviewed using smartphone apps (e.g., calorieking, myfitnesspal). Emailed and sent a Mychart message with information on how to carb count and ideal smartphone apps to use. Other noticeable trend is variability in BG readings likely due to inaccurate carb count. Will focus on Tanya Johnson taking photos of meals and write down her math when carb counting meals 1-2 weeks prior to our follow up appointment. Continue all other insulin doses. Continue wearing a Dexcom G6 continuous glucose monitor (CGM). Follow up 05/14/23.  Plan: Medications:  Continue Evaristo Bury Flextouch pen (100 units/mL; 1 unit increments) 26 units daily Change Novolog Flexpen disposable pen (1 unit increments)  ICR: 5 --> 5 (day), 8 (bed) ISF: 30 --> 30 (day), 40 (bed) Target BG: 120 mg/dL (day) and 409 mg/dL (bed) Education: Reviewed how to carb count without nutrition facts (reviewed common carbs without nutrition facts (pasta, rice, starchy vegetables, fruit) and reviewed using smartphone apps (e.g., calorieking, myfitnesspal).  Emailed and sent a Mychart message with information on how to carb count and ideal smartphone apps to use. Will focus on Tanya Johnson taking photos of meals and write down her math when carb counting meals 1-2 weeks prior to our follow up appointment.  Monitoring:  Continue wearing a Dexcom G6 continuous glucose monitor (CGM). Tanya Johnson has a diagnosis of diabetes, checks blood glucose readings > 4x per day, treats with  > 4 insulin injections daily, and requires frequent adjustments to insulin regimen. Euretha will be seen every six months, minimally, to assess adherence to their CGM regimen and diabetes treatment plan. Kayslee and caregiver are willing to use device  as prescribed. Follow Up: 05/14/23  This appointment required 60 minutes of patient care (this includes precharting, chart review, review of results, face-to-face care, etc.).  Thank you for involving clinical pharmacist/diabetes educator to assist in providing this patient's care.  Zachery Conch, PharmD, BCACP, CDCES, CPP

## 2023-04-16 NOTE — Telephone Encounter (Signed)
Fax from [employer] pt needs PA for [employer]. Initiated on covermymeds.   I think covermymeds is glitching, I had just completed the PA submission, and clicked send to plan, and when I came back up it was a different patient name. I will reevaluate this later today to see if it has updated.

## 2023-04-19 ENCOUNTER — Telehealth (INDEPENDENT_AMBULATORY_CARE_PROVIDER_SITE_OTHER): Payer: Self-pay

## 2023-04-19 ENCOUNTER — Other Ambulatory Visit (INDEPENDENT_AMBULATORY_CARE_PROVIDER_SITE_OTHER): Payer: Self-pay | Admitting: Pharmacist

## 2023-04-19 ENCOUNTER — Telehealth (INDEPENDENT_AMBULATORY_CARE_PROVIDER_SITE_OTHER): Payer: Self-pay | Admitting: Pharmacist

## 2023-04-19 DIAGNOSIS — E1065 Type 1 diabetes mellitus with hyperglycemia: Secondary | ICD-10-CM

## 2023-04-19 MED ORDER — DEXCOM G6 TRANSMITTER MISC: 1 refills | Status: AC

## 2023-04-19 MED ORDER — DEXCOM G7 SENSOR MISC: 5 refills | Status: AC

## 2023-04-19 NOTE — Telephone Encounter (Signed)
Called patient on 04/19/2023 at 9:23 AM   Explained I thought a prescription had been sent in for Dexcom G7 sensors. Looks like there hasn't and looks like she got G6 sensors from pharmacy on 04/16/23. Explained due to insurance she cannot get Dexcom G7 sensors until 05/17/23. I sent in a G7 sensors prescription and I also sent in a G6 transmitter prescritpion. I advised her to attempt to pick up the [facility] G6 transmitter and if for some reason she cannot get the G6 transmitter to let us know and we can give her samples for G7 sensors for a month. Once I see her for follow up on 6/17 I will delete the G6 from her med list.   Thank you for involving clinical pharmacist/diabetes educator to assist in providing this patient's care.   Zachery Conch, PharmD, BCACP, CDCES, CPP

## 2023-04-19 NOTE — Progress Notes (Signed)
To clarify - patient was previously using Dexcom G6 CGM device,  but has transitioned to use a Dexcom G7 CGM device.  Thank you for involving clinical pharmacist/diabetes educator to assist in providing this patient's care.   Zachery Conch, PharmD, BCACP, CDCES, CPP

## 2023-04-19 NOTE — Progress Notes (Signed)
Tanya Johnson was seen in clinic today for teaching and placement of a Dexcom G7. Training was done by Angelene Giovanni, RN. Tanya Johnson was excited to have her new Dexcom and did not have any additional questions.   Dessa Phi, MD

## 2023-04-20 NOTE — Telephone Encounter (Signed)
Prior authorization completed, appear Dr. Ladona Ridgel also started working on this while I was and reached out to the patient, see telephone call by Dr. Ladona Ridgel on 04/19/23

## 2023-05-07 ENCOUNTER — Ambulatory Visit (INDEPENDENT_AMBULATORY_CARE_PROVIDER_SITE_OTHER): Payer: Medicaid Other | Admitting: Pediatric Endocrinology

## 2023-05-07 ENCOUNTER — Encounter (INDEPENDENT_AMBULATORY_CARE_PROVIDER_SITE_OTHER): Payer: Self-pay | Admitting: Pediatric Endocrinology

## 2023-05-07 VITALS — BP 120/78 | HR 116 | Wt 317.2 lb

## 2023-05-07 DIAGNOSIS — Z6841 Body Mass Index (BMI) 40.0 and over, adult: Secondary | ICD-10-CM

## 2023-05-07 DIAGNOSIS — E1065 Type 1 diabetes mellitus with hyperglycemia: Secondary | ICD-10-CM

## 2023-05-07 LAB — POCT GLYCOSYLATED HEMOGLOBIN (HGB A1C): HbA1c POC (<> result, manual entry): 9.4 % (ref 4.0–5.6)

## 2023-05-07 LAB — POCT GLUCOSE (DEVICE FOR HOME USE): POC Glucose: 118 mg/dl — AB (ref 70–99)

## 2023-05-07 NOTE — Patient Instructions (Signed)
Look at [facility] and Twiist which are both new pump systems.

## 2023-05-07 NOTE — Progress Notes (Signed)
Subjective:  Subjective  Patient Name: Tanya Johnson Date of Birth: 2006/07/09  MRN: [id number]  Tanya Johnson  presents to the office today for follow-up evaluation and management of her type 1 diabetes and severe pediatric obesity.   HISTORY OF PRESENT ILLNESS:   Tanya Johnson is a 17 y.o. AA female   Tanya Johnson was accompanied by her mom  1. Tanya Johnson was previously followed in pediatric endocrine clinic. She was lost to follow up from spring 2020 to to spring 2021 due to the Covid 19 pandemic. During that year she developed overt diabetes. She was admitted to [facility] on 03/04/20 for initiation of subcutaneous insulin. 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   2. The patient's last [facility] visit was on 04/02/23. In the interim she has been doing ok.   She feels that her diabetes care has been a little better. She feels that she still has room for improvement but that things are definitely better than they were last month.   She is currently wearing a Dexcom G6 and taking injections. She feels that she is doing well with her injections. She does not want to go back to the OmniPod. She does want to go on G7 but there was an issue and she received a month supply of G6. They are unable to switch her to G7 until next month.   She is open to trying another insulin pump- preferably one that works with G7 and that she can utilize from her phone so that she does not need to carry 2 devices.    Insulin:  Tresiba 26 units Novolog 120/30/5 Lisinopril   3. Pertinent Review of Systems:  Constitutional: The patient feels "good". The patient seems healthy and active. Eyes: Vision seems to be good. There are no recognized eye problems.  Last eye exam November 2023- got new glasses.  Neck: The patient has no complaints of anterior neck swelling, soreness, tenderness, pressure, discomfort, or difficulty swallowing.   Heart: Heart rate increases with  exercise or other physical activity. The patient has no complaints of palpitations, irregular heart beats, chest pain, or chest pressure.   Lungs: Started on Montelukast for her asthma. Last bad asthma when "younger" Gastrointestinal: Bowel movents seem normal. The patient has no complaints of excessive hunger, acid reflux, upset stomach, stomach aches or pains, diarrhea, or constipation.  Legs: Muscle mass and strength seem normal. There are no complaints of numbness, tingling, burning, or pain. No edema is noted.  Feet: There are no obvious foot problems. There are no complaints of numbness, tingling, burning, or pain. No edema is noted. Neurologic: There are no recognized problems with muscle movement and strength, sensation, or coordination. GYN/GU:  LMP 04/01/23   Diabetes Alert: none  Annual labs May 2024. LDL 149- will repeat in November 2024- if still elevated will plan to start Statin at that time.  Hypoglycemia- No severe lows recently.  Dexcom CGM    PAST MEDICAL, FAMILY, AND SOCIAL HISTORY  Past Medical History:  Diagnosis Date   Abnormal food appetite 02/21/2017   Allergic rhinitis 09/05/2013   Elevated transaminase level 03/04/2020   Hypertension    Obesity, unspecified 09/05/2013   Prediabetes 03/27/2014   Hgb A1C 5.9%     Unspecified asthma(493.90) 09/05/2013    Family History  Problem Relation Age of Onset   Diabetes Mother    Hypertension Mother    Asthma Sister    Obesity Paternal Aunt  Obesity Paternal Grandmother    Diabetes Paternal Grandmother    Hypertension Paternal Grandmother    Diabetes Paternal Grandfather    Heart disease Paternal Grandfather    Hypertension Paternal Grandfather    Obesity Paternal Grandfather    Bronchitis Sister      Current Outpatient Medications:    Accu-Chek FastClix Lancets MISC, Check sugar up to 6 times daily. For use with FAST CLIX Lancet Device, Disp: 204 each, Rfl: 3   BD PEN NEEDLE MICRO U/F 32G X 6 MM MISC, USE TO  INJECT INSULIN 6 TIMES A DAILY, Disp: 200 each, Rfl: 5   BD PEN NEEDLE NANO 2ND GEN 32G X 4 MM MISC, USE TO INJECT INSULIN 6 TIMES DAILY, Disp: 200 each, Rfl: 5   Continuous Blood Gluc Transmit (DEXCOM G6 TRANSMITTER) MISC, Use with Dexcom Sensor, reuse transmitter for 3 months., Disp: 1 each, Rfl: 1   Continuous Glucose Sensor (DEXCOM G7 SENSOR) MISC, Use 1 sensor as directed every 10 days to monitor glucose continuously., Disp: 3 each, Rfl: 5   Continuous Glucose Transmitter (DEXCOM G6 TRANSMITTER) MISC, Use 1 transmitter as directed every 90 days, Disp: 1 each, Rfl: 1   glucose blood (ACCU-CHEK GUIDE) test strip, USE TO CHECK BLOOD SUGAR SIX TIMES DAILY, Disp: 200 strip, Rfl: 5   Insulin Aspart FlexPen (NOVOLOG) 100 UNIT/ML, ADMINISTER UP TO 100 UNITS UNDER THE SKIN EVERY DAY AS DIRECTED BY PRESCRIBER, Disp: 30 mL, Rfl: 5   insulin degludec (TRESIBA FLEXTOUCH) 100 UNIT/ML FlexTouch Pen, ADMINISTER UP TO 50 UNITS UNDER THE SKIN DAILY AS DIRECTED, Disp: 15 mL, Rfl: 5   Lancets Misc. (ACCU-CHEK FASTCLIX LANCET) KIT, Check sugar 6 times daily, Disp: 1 kit, Rfl: 1   lisinopril (ZESTRIL) 20 MG tablet, Take by mouth., Disp: , Rfl:    Ostomy Supplies (SKIN TAC ADHESIVE BARRIER WIPE) MISC, For use with Dexcom, Disp: 50 each, Rfl: 1   acetone, urine, test strip, Check ketones per protocol (Patient not taking: Reported on 04/16/2023), Disp: 50 each, Rfl: 3   Continuous Blood Gluc Receiver (DEXCOM G6 RECEIVER) DEVI, Use with Dexcom Sensor and Transmitter to check Blood Sugars (Patient not taking: Reported on 02/21/2021), Disp: 1 each, Rfl: 0   fluticasone (FLONASE) 50 MCG/ACT nasal spray, Place 1 spray into both nostrils daily. (Patient not taking: Reported on 07/22/2020), Disp: 16 g, Rfl: 12   Glucagon (BAQSIMI TWO PACK) 3 MG/DOSE POWD, Place 1 each into the nose as needed (severe hypoglycmia with unresponsiveness). (Patient not taking: Reported on 04/26/2021), Disp: 1 each, Rfl: 3   Glucose Blood (IGLUCOSE TEST  STRIPS VI), USE AS INSTRUCTED FOR 6 CHECKS DAILY PER PROTOCOL FOR HYPER/HYPOGLYCEMIA (Patient not taking: Reported on 04/16/2023), Disp: , Rfl:    insulin aspart (NOVOLOG) 100 UNIT/ML injection, Inject up to 200 units into insulin pump as directed every 2-3 days. Please fill for VIAL. (Patient not taking: Reported on 04/16/2023), Disp: 30 mL, Rfl: 5   loratadine (CLARITIN) 10 MG tablet, Take 1 tablet (10 mg total) by mouth daily. (Patient not taking: Reported on 07/22/2020), Disp: 30 tablet, Rfl: 11   metFORMIN (GLUCOPHAGE-XR) 500 MG 24 hr tablet, TAKE 1 TABLET(500 MG) BY MOUTH IN THE MORNING AND AT BEDTIME (Patient not taking: Reported on 05/07/2023), Disp: 60 tablet, Rfl: 5   montelukast (SINGULAIR) 4 MG chewable tablet, Chew 1 tablet (4 mg total) by mouth at bedtime. (Patient not taking: Reported on 07/22/2020), Disp: 30 tablet, Rfl: 6  Allergies as of 05/07/2023   (No Known  Allergies)     reports that she has never smoked. She has been exposed to tobacco smoke. She has never used smokeless tobacco. She reports that she does not currently use drugs after having used the following drugs: Marijuana. She reports that she does not drink alcohol. Pediatric History  Patient Parents   Gibbs,Junious (Father)   Sport and exercise psychologist (Mother)   Other Topics Concern   Not on file  Social History Narrative   Lives with dad, PGF and PGM and Paternal Aunt, brother age 49 year old   Goes to [place] on weekends. Sisters 3 and 2 live with mom      2018 lives with mom and stepdad,and siblings  mom smokes   Visits dad on weekends.      She is in 9th grade       she gets A's and B's. She enjoys playing outside, listening to music, and drawing.       2021: stays with grandmother most of the time, but also sometimes stays with mom      2022: 10th grade at [employer].       [employer]24[employer] 12th grade 23-24 school year    1. School and Family: 12th grade at [employer] 2.  Activities:  [employer] 3. Primary Care Provider: Lucio Edward, MD  ROS: There are no other significant problems involving Via's other body systems.    Objective:  Objective  Vital Signs:   BP 120/78 (BP Location: Left Arm, Patient Position: Sitting, Cuff Size: Large)   Pulse (!) 116 Comment: pt is nervous, this is her first visit with out parents  Wt (!) 317 lb 3.2 oz (143.9 kg)   LMP 03/28/2023 (Approximate)   BMI 56.19 kg/m   Blood pressure %iles are not available for patients who are 18 years or older.  Ht Readings from Last 3 Encounters:  03/27/23 5\' 3"  (1.6 m) (31 %, Z= -0.50)*  03/26/23 5\' 3"  (1.6 m) (31 %, Z= -0.50)*  02/04/23 5\' 3"  (1.6 m) (31 %, Z= -0.49)*   * Growth percentiles are based on CDC (Girls, 2-20 Years) data.   Wt Readings from Last 3 Encounters:  05/07/23 (!) 317 lb 3.2 oz (143.9 kg) (>99 %, Z= 2.84)*  04/02/23 (!) 316 lb 12.8 oz (143.7 kg) (>99 %, Z= 2.83)*  03/27/23 (!) 314 lb (142.4 kg) (>99 %, Z= 2.81)*   * Growth percentiles are based on CDC (Girls, 2-20 Years) data.   HC Readings from Last 3 Encounters:  No data found for [facility]   Body surface area is 2.53 meters squared. No height on file for this encounter. >99 %ile (Z= 2.84) based on CDC (Girls, 2-20 Years) weight-for-age data using vitals from 05/07/2023.  PHYSICAL EXAM:  Physical Exam Constitutional:      Appearance: Normal appearance. She is obese.  HENT:     Head: Normocephalic.     Right Ear: External ear normal.     Left Ear: External ear normal.     Nose: Nose normal.     Comments: Bilateral piercings    Mouth/Throat:     Mouth: Mucous membranes are moist.  Eyes:     Extraocular Movements: Extraocular movements intact.  Pulmonary:     Effort: Pulmonary effort is normal.  Musculoskeletal:        General: Normal range of motion.     Cervical back: Normal range of motion.  Skin:    General: Skin is warm and dry.  Neurological:     General: No focal deficit present.      Mental Status: She is alert.  Psychiatric:        Mood and Affect: Mood normal.        Behavior: Behavior normal.     LAB DATA:    Lab Results  Component Value Date   HGBA1C 9.4 05/07/2023   HGBA1C 8.7 (A) 08/15/2021   HGBA1C 11.0 (A) 02/21/2021   HGBA1C 7.5 (A) 07/22/2020   HGBA1C 9.9 (H) 03/04/2020   HGBA1C 10.1 (A) 03/04/2020   HGBA1C 6.3 (A) 01/22/2019   HGBA1C 6.4 (H) 09/27/2018      Results for orders placed or performed in visit on 05/07/23  POCT glycosylated hemoglobin (Hb A1C)  Result Value Ref Range   Hemoglobin A1C     HbA1c POC (<> result, manual entry) 9.4 4.0 - 5.6 %   HbA1c, POC (prediabetic range)     HbA1c, POC (controlled diabetic range)    POCT Glucose (Device for Home Use)  Result Value Ref Range   Glucose Fasting, POC     POC Glucose 118 (A) 70 - 99 mg/dl       Assessment and Plan:  Assessment  ASSESSMENT: Amahri is a 17 y.o. AA female with uncontrolled diabetes.    Diabetes, uncontrolled, type 1 (GAD antibody positive) with insulin resistance (high c-peptide and acanthosis)  - She has been wearing Dexcom G6 (Should be G7) and taking insulin injections - She is not ready for insulin pump therapy at this time- but would like to consider it for the future - She has started to identify ways to improve her diabetes management - Overall care is improving   PLAN:   1. Diagnostic: Lab Orders         POCT glycosylated hemoglobin (Hb A1C)         POCT Glucose (Device for Home Use)     2. Therapeutic: Continue Novolog 120/30/5. Continue Tresiba 26 units.   Patient Instructions  Look at [facility] and [employer]ist which are both new pump systems.     3. Patient education: Discussion of the above.  4. Follow-up: Return in about 3 months (around 08/05/2023).    2-3 weeks with Dr. Ladona Ridgel for review of Dexcom data and insulin dose adjustment.   Dessa Phi, MD   LOS >40 minutes spent today reviewing the medical chart, counseling the patient/family,  and documenting today's encounter. When a patient is on insulin, intensive monitoring of blood glucose levels is necessary to avoid hyperglycemia and hypoglycemia. Severe hyperglycemia/hypoglycemia can lead to hospital admissions and be life threatening.    Patient referred by Richrd Sox, MD for new onset diabetes  Copy of this note sent to Lucio Edward, MD

## 2023-05-13 NOTE — Progress Notes (Deleted)
This is a Pediatric Specialist E-Visit (My Chart Video Visit) follow up consult provided via Caregility Tanya Johnson consented to an E-Visit consult today Location of patient: Tanya Johnson is at home  Location of provider: Zachery Johnson, PharmD, BCACP, CDCES, CPP is working remotely   S:     No chief complaint on file.   Endocrinology provider: Dessa Phi, MD  Patient referred to me for diabetes management. PMH significant for T1DM (dx 03/04/20; 10.1% (03/04/20 15:32) --> 9.9% (03/04/20 20:01), insulin ab negative, GAD65 ab positive (5161.1 U/mL), pancreatic islet cell ab elevated (2.5 ng/mL), c-peptide elevated (2.5 ng/mL), no ZnT8 ab or IA-2 ab), insulin resistance, HTN, allergic rhinitis, PCOS, goiter, hyperandrogenism. Of note, patient previously established care with Tanya Johnson on 02/21/2017 as she had been referred by her primary care provider due to weight gain and A1c of 6.2%; she was being managed for prediabetes (managed with lifestyle changes and use of metformin) but then patient was lost to follow up from Spring 2020 to to Spring 2021 due to the COVID-19 pandemic. Patient manages DM via multiple daily insulin injections and by wearing a Dexcom G6 continuous glucose monitor (CGM).  I connected with Tanya Johnson on 05/14/2023 by video and verified that I am speaking with the correct person using two identifiers. ***  School: virtual  -Grade level: 12th  Insurance Coverage: [place] Managed Medicaid (Healthy [employer])  [facility] [phone] - [place], [place] - [address], [place] [address] Phone: [phone]  Fax: [phone] DEA #: [id number]   Diabetes Medication Regimen  -Basal insulin: Evaristo Bury Flextouch pen (100 units/mL; 1 unit increments) 26 units daily -Bolus insulin: Novolog Flexpen disposable pen (1 unit increments) (using dosing charts) --ICR: 5 (day), 8  (bed) --ISF: 30 (day), 40 (bed) --Target BG: 120 mg/dL (day) and 132 mg/dL (bed) -Other diabetes medication(s): Trulicity (insurance stopped covering), metformin (GI upset, "stopped reading based on what she saw on the internet")  Diet (*** changes since prior appt 04/16/23) Patient/guardian reports patient's dietary habits:  Breakfast (9-10am): 10-11 units Lunch (2-3pm): 10-11 units Dinner (6-7pm): 10-11 units Snacks (between lunch and dinner (3-4pm): sometimes gives insulin (around 15-20 grams of carb) Drinks: water, diet sprite, diet dr pepper, juice (sugar juice; 1-2x each week)  Exercise (*** changes since prior appt 04/16/23) Patient/guardian reports patient's exercise habits: none   O:   Labs:   Dexcom Clarity Report    There were no vitals filed for this visit.  HbA1c Lab Results  Component Value Date   HGBA1C 9.4 05/07/2023   HGBA1C 8.7 (A) 08/15/2021   HGBA1C 11.0 (A) 02/21/2021    Pancreatic Islet Cell Autoantibodies Lab Results  Component Value Date   ISLETAB Negative 03/04/2020    Insulin Autoantibodies Lab Results  Component Value Date   INSULINAB <5.0 03/04/2020    Glutamic Acid Decarboxylase Autoantibodies Lab Results  Component Value Date   GLUTAMICACAB >250 (H) 02/21/2021    ZnT8 Autoantibodies No results found for: "ZNT8AB"  IA-2 Autoantibodies No results found for: "LABIA2"  C-Peptide Lab Results  Component Value Date   CPEPTIDE 1.96 04/02/2023    Microalbumin Lab Results  Component Value Date   MICRALBCREAT 7 01/09/2018    Lipids    Component Value Date/Time   CHOL 216 (H) 04/02/2023 1127   CHOL 211 (H) 09/27/2018 1430   TRIG 97 (H) 04/02/2023 1127   HDL  46 04/02/2023 1127   HDL 37 (L) 09/27/2018 1430   CHOLHDL 4.7 04/02/2023 1127   LDLCALC 149 (H) 04/02/2023 1127    Assessment: TIR is not at goal > 70%. Hypoglycemia ***. ***. Continue wearing a Dexcom G7 continuous glucose monitor (CGM). Follow up  ***.  Plan: Medications:  *** Evaristo Bury Flextouch pen (100 units/mL; 1 unit increments) 26 units daily *** Novolog Flexpen disposable pen (1 unit increments)  ICR: 5 (day), 8 (bed) ISF: 30 (day), 40 (bed) Target BG: 120 mg/dL (day) and 213 mg/dL (bed) Education: *** Monitoring:  Continue wearing a Dexcom G7 continuous glucose monitor (CGM). Tanya Johnson has a diagnosis of diabetes, checks blood glucose readings > 4x per day, treats with  > 4 insulin injections daily, and requires frequent adjustments to insulin regimen. [name] will be seen every six months, minimally, to assess adherence to their CGM regimen and diabetes treatment plan. [name] and caregiver are willing to use device as prescribed. Follow Up: ***  This appointment required *** minutes of patient care (this includes precharting, chart review, review of results, virtual care, etc.).  Thank you for involving clinical pharmacist/diabetes educator to assist in providing this patient's care.  Tanya Johnson, PharmD, BCACP, CDCES, CPP

## 2023-05-14 ENCOUNTER — Telehealth (INDEPENDENT_AMBULATORY_CARE_PROVIDER_SITE_OTHER): Payer: Self-pay | Admitting: Pharmacist

## 2023-05-14 NOTE — Telephone Encounter (Signed)
Sent patient two text message invitations to virtual appointment spaced out 10 minutes apart.  Then called patient on 05/14/2023 10 more minutes later and left HIPAA-compliant voicemail with instructions to call [facility] back.  Plan to discuss virtual follow up appointment scheduled with myself at 11:30 am on 05/14/2023.  Will await for patient to return call at this time.   Thank you for involving pharmacy/diabetes educator to assist in providing this patient's care.   Zachery Conch, PharmD, BCACP, CDCES, CPP

## 2023-05-24 ENCOUNTER — Telehealth (INDEPENDENT_AMBULATORY_CARE_PROVIDER_SITE_OTHER): Payer: Self-pay | Admitting: Pharmacist

## 2023-05-24 NOTE — Progress Notes (Deleted)
This is a Pediatric Specialist E-Visit (My Chart Video Visit) follow up consult provided via Caregility Tanya Johnson consented to an E-Visit consult today Location of patient: Tanya Johnson is at home  Location of provider: Zachery Conch, PharmD, BCACP, CDCES, CPP is working remotely   S:     No chief complaint on file.   Endocrinology provider: Dessa Phi, MD  Patient referred to me for diabetes management. PMH significant for T1DM (dx 03/04/20; 10.1% (03/04/20 15:32) --> 9.9% (03/04/20 20:01), insulin ab negative, GAD65 ab positive (5161.1 U/mL), pancreatic islet cell ab elevated (2.5 ng/mL), c-peptide elevated (2.5 ng/mL), no ZnT8 ab or IA-2 ab), insulin resistance, HTN, allergic rhinitis, PCOS, goiter, hyperandrogenism. Of note, patient previously established care with Dr. Vanessa [place] on 02/21/2017 as she had been referred by her primary care provider due to weight gain and A1c of 6.2%; she was being managed for prediabetes (managed with lifestyle changes and use of metformin) but then patient was lost to follow up from Spring 2020 to to Spring 2021 due to the COVID-19 pandemic. Patient manages DM via multiple daily insulin injections and by wearing a Dexcom G6 continuous glucose monitor (CGM).  I connected with Tanya Johnson on 05/24/2023 by video and verified that I am speaking with the correct person using two identifiers. ***  School: virtual  -Grade level: 12th  Insurance Coverage: [place] Managed Medicaid (Healthy [employer])  Preferred Pharmacy [facility] [phone] - [place], [place] - [address], [place] [address] Phone: [phone]  Fax: [phone] DEA #: [id number]   Diabetes Medication Regimen  -Basal insulin: Evaristo Bury Flextouch pen (100 units/mL; 1 unit increments) 26 units daily -Bolus insulin: Novolog Flexpen disposable pen (1 unit increments) (using dosing charts) --ICR: 5 (day), 8  (bed) --ISF: 30 (day), 40 (bed) --Target BG: 120 mg/dL (day) and 614 mg/dL (bed) -Other diabetes medication(s): Trulicity (insurance stopped covering), metformin (GI upset, "stopped reading based on what she saw on the internet")  Diet (*** changes since prior appt 04/16/23) Patient/guardian reports patient's dietary habits:  Breakfast (9-10am): 10-11 units Lunch (2-3pm): 10-11 units Dinner (6-7pm): 10-11 units Snacks (between lunch and dinner (3-4pm): sometimes gives insulin (around 15-20 grams of carb) Drinks: water, diet sprite, diet dr pepper, juice (sugar juice; 1-2x each week)  Exercise (*** changes since prior appt 04/16/23) Patient/guardian reports patient's exercise habits: none   O:   Labs:   Dexcom Clarity Report    There were no vitals filed for this visit.  HbA1c Lab Results  Component Value Date   HGBA1C 9.4 05/07/2023   HGBA1C 8.7 (A) 08/15/2021   HGBA1C 11.0 (A) 02/21/2021    Pancreatic Islet Cell Autoantibodies Lab Results  Component Value Date   ISLETAB Negative 03/04/2020    Insulin Autoantibodies Lab Results  Component Value Date   INSULINAB <5.0 03/04/2020    Glutamic Acid Decarboxylase Autoantibodies Lab Results  Component Value Date   GLUTAMICACAB >250 (H) 02/21/2021    ZnT8 Autoantibodies No results found for: "ZNT8AB"  IA-2 Autoantibodies No results found for: "LABIA2"  C-Peptide Lab Results  Component Value Date   CPEPTIDE 1.96 04/02/2023    Microalbumin Lab Results  Component Value Date   MICRALBCREAT 7 01/09/2018    Lipids    Component Value Date/Time   CHOL 216 (H) 04/02/2023 1127   CHOL 211 (H) 09/27/2018 1430   TRIG 97 (H) 04/02/2023 1127   HDL  46 04/02/2023 1127   HDL 37 (L) 09/27/2018 1430   CHOLHDL 4.7 04/02/2023 1127   LDLCALC 149 (H) 04/02/2023 1127    Assessment: TIR is not at goal > 70%. Hypoglycemia ***. ***. Continue wearing a Dexcom G7 continuous glucose monitor (CGM). Follow up  ***.  Plan: Medications:  *** Evaristo Bury Flextouch pen (100 units/mL; 1 unit increments) 26 units daily *** Novolog Flexpen disposable pen (1 unit increments)  ICR: 5 (day), 8 (bed) ISF: 30 (day), 40 (bed) Target BG: 120 mg/dL (day) and 213 mg/dL (bed) Education: *** Monitoring:  Continue wearing a Dexcom G7 continuous glucose monitor (CGM). Tanya Johnson has a diagnosis of diabetes, checks blood glucose readings > 4x per day, treats with  > 4 insulin injections daily, and requires frequent adjustments to insulin regimen. Tanya Johnson will be seen every six months, minimally, to assess adherence to their CGM regimen and diabetes treatment plan. Tanya Johnson and caregiver are willing to use device as prescribed. Follow Up: ***  This appointment required *** minutes of patient care (this includes precharting, chart review, review of results, virtual care, etc.).  Thank you for involving clinical pharmacist/diabetes educator to assist in providing this patient's care.  Zachery Conch, PharmD, BCACP, CDCES, CPP

## 2023-05-25 ENCOUNTER — Encounter (INDEPENDENT_AMBULATORY_CARE_PROVIDER_SITE_OTHER): Payer: Self-pay | Admitting: Pharmacist

## 2023-05-25 ENCOUNTER — Telehealth (INDEPENDENT_AMBULATORY_CARE_PROVIDER_SITE_OTHER): Payer: Medicaid Other | Admitting: Pharmacist

## 2023-05-25 DIAGNOSIS — E1065 Type 1 diabetes mellitus with hyperglycemia: Secondary | ICD-10-CM

## 2023-05-25 NOTE — Progress Notes (Signed)
This is a Pediatric Specialist E-Visit (My Chart Video Visit) follow up consult provided via Caregility Tanya Johnson consented to an E-Visit consult today Location of patient: STASI LANDMARK is at home  Location of provider: Zachery Conch, PharmD, BCACP, CDCES, CPP is working remotely   S:     Chief Complaint  Patient presents with   Diabetes    Medication Management Follow Up     Endocrinology provider: Dessa Phi, MD  Patient referred to me for diabetes management. PMH significant for T1DM (dx 03/04/20; 10.1% (03/04/20 15:32) --> 9.9% (03/04/20 20:01), insulin ab negative, GAD65 ab positive (5161.1 U/mL), pancreatic islet cell ab elevated (2.5 ng/mL), c-peptide elevated (2.5 ng/mL), no ZnT8 ab or IA-2 ab), insulin resistance, HTN, allergic rhinitis, PCOS, goiter, hyperandrogenism. Of note, patient previously established care with Dr. Vanessa [place] on 02/21/2017 as she had been referred by her primary care provider due to weight gain and A1c of 6.2%; she was being managed for prediabetes (managed with lifestyle changes and use of metformin) but then patient was lost to follow up from Spring 2020 to to Spring 2021 due to the COVID-19 pandemic. Patient manages DM via multiple daily insulin injections and by wearing a Dexcom G6 continuous glucose monitor (CGM).  I connected with Tanya Johnson on 05/25/2023 by video and verified that I am speaking with the correct person using two identifiers. She reports she has been too busy to carb count new food items. She report she is eating foods she knows how many carbs she has. She reports Mondays, Thursdays, and Fridays are the easiest days to take insulin, but it is challenging to take insulin on other days as she is on the go. She reports she is currently using the Dexcom G7 sensor.   School: virtual  -Grade level: 12th  Insurance Coverage: [place] Managed Medicaid (Healthy [employer])  Preferred Pharmacy [facility] [phone] - [place],   - [address], [place] [address] Phone: [phone]  Fax: [phone] DEA #: [id number]   Diabetes Medication Regimen  -Basal insulin: Evaristo Bury Flextouch pen (100 units/mL; 1 unit increments) 26 units daily -Bolus insulin: Novolog Flexpen disposable pen (1 unit increments) (using dosing charts) --ICR: 5 (day), 8 (bed) --ISF: 30 (day), 40 (bed) --Target BG: 120 mg/dL (day) and 664 mg/dL (bed) -Other diabetes medication(s): Trulicity (insurance stopped covering), metformin (GI upset, "stopped reading based on what she saw on the internet")  Diet (changes since prior appt 04/16/23) Patient/guardian reports patient's dietary habits:  Breakfast (9-10am): oatmeal (15-20 grams), cereal (cheerios, 27 grams); 12-14 units Lunch (2-3pm): sandwiches (30 grams), ramen noodles (34 grams), small bag of doritos (15 grams); 12-14 units Dinner (6-7pm): hamburger (27 grams for bun), pork chop, mashed potatoes (10-12 grams); 12-14 units Snacks (between lunch and dinner (3-4pm): jello (17 grams), yogurt (0 carbs), chocolate pudding (17 grams); 7-8 units  Drinks: water, diet sprite, diet dr pepper, juice (sugar juice; 1-2x each week)  Exercise (no changes since prior appt 04/16/23) Patient/guardian reports patient's exercise habits: none   O:   Labs:   Dexcom Clarity Report     There were no vitals filed for this visit.  HbA1c Lab Results  Component Value Date   HGBA1C 9.4 05/07/2023   HGBA1C 8.7 (A) 08/15/2021   HGBA1C 11.0 (A) 02/21/2021    Pancreatic Islet Cell Autoantibodies Lab Results  Component Value Date   ISLETAB Negative 03/04/2020  Insulin Autoantibodies Lab Results  Component Value Date   INSULINAB <5.0 03/04/2020    Glutamic Acid Decarboxylase Autoantibodies Lab Results  Component Value Date   GLUTAMICACAB >250 (H) 02/21/2021    ZnT8 Autoantibodies No results found for: "ZNT8AB"  IA-2  Autoantibodies No results found for: "LABIA2"  C-Peptide Lab Results  Component Value Date   CPEPTIDE 1.96 04/02/2023    Microalbumin Lab Results  Component Value Date   MICRALBCREAT 7 01/09/2018    Lipids    Component Value Date/Time   CHOL 216 (H) 04/02/2023 1127   CHOL 211 (H) 09/27/2018 1430   TRIG 97 (H) 04/02/2023 1127   HDL 46 04/02/2023 1127   HDL 37 (L) 09/27/2018 1430   CHOLHDL 4.7 04/02/2023 1127   LDLCALC 149 (H) 04/02/2023 1127    Assessment: TIR is not at goal > 70%. No hypoglycemia. Hyperglycemia related to nonadherence. Advised patient to set an alarm on phone and focus on taking [name] and Novolog daily with dinner. Continue all insulin doses. Assisted patient with carb counting (see below); she is mostly carb counting correctly. Encouraged patient for carb counting correctly. Continue wearing a Dexcom G7 continuous glucose monitor (CGM). Follow up 06/04/23 to discuss potentially transitioning to insulin pump.   Plan: Medications:  Continue Evaristo Bury Flextouch pen (100 units/mL; 1 unit increments) 26 units daily Continue Novolog Flexpen disposable pen (1 unit increments)  ICR: 5 (day), 8 (bed) ISF: 30 (day), 40 (bed) Target BG: 120 mg/dL (day) and 161 mg/dL (bed) Education: Assisted patient with carb counting  Diet (changes since prior appt 04/16/23) Patient/guardian reports patient's dietary habits:  Breakfast (9-10am): oatmeal (15-20 grams), cereal (cheerios, 27 grams) Lunch (2-3pm): sandwiches (30 grams), ramen noodles (50 grams) Dinner (6-7pm): hamburger (27 grams for bun), pork chop, mashed potatoes (15 grams) Snacks (between lunch and dinner (3-4pm): jello (17 grams), yogurt (0 carbs), chocolate pudding (17 grams) Drinks: water, diet sprite, diet dr pepper, juice (sugar juice; 1-2x each week)  Monitoring:  Continue wearing a Dexcom G7 continuous glucose monitor (CGM). Tanya Johnson has a diagnosis of diabetes, checks blood glucose readings >  4x per day, treats with  > 4 insulin injections daily, and requires frequent adjustments to insulin regimen. Ganiya will be seen every six months, minimally, to assess adherence to their CGM regimen and diabetes treatment plan. Aideen and caregiver are willing to use device as prescribed. Follow Up: 06/04/23  This appointment required 45 minutes of patient care (this includes precharting, chart review, review of results, virtual care, etc.).  Thank you for involving clinical pharmacist/diabetes educator to assist in providing this patient's care.  Zachery Conch, PharmD, BCACP, CDCES, CPP

## 2023-06-01 ENCOUNTER — Encounter (INDEPENDENT_AMBULATORY_CARE_PROVIDER_SITE_OTHER): Payer: Self-pay

## 2023-06-02 ENCOUNTER — Other Ambulatory Visit (INDEPENDENT_AMBULATORY_CARE_PROVIDER_SITE_OTHER): Payer: Self-pay | Admitting: Pediatric Endocrinology

## 2023-06-02 DIAGNOSIS — E1065 Type 1 diabetes mellitus with hyperglycemia: Secondary | ICD-10-CM

## 2023-06-04 ENCOUNTER — Telehealth (INDEPENDENT_AMBULATORY_CARE_PROVIDER_SITE_OTHER): Payer: Self-pay | Admitting: Pharmacist

## 2023-06-04 ENCOUNTER — Encounter (INDEPENDENT_AMBULATORY_CARE_PROVIDER_SITE_OTHER): Payer: Self-pay | Admitting: Pharmacist

## 2023-06-04 NOTE — Progress Notes (Deleted)
This is a Pediatric Specialist E-Visit (My Chart Video Visit) follow up consult provided via Caregility Tanya Johnson consented to an E-Visit consult today Location of patient: Tanya Johnson are at home  Location of provider: Zachery Conch, PharmD, BCACP, CDCES, CPP is working remotely  S:     No chief complaint on file.   Endocrinology provider: Dessa Phi, MD  Patient has decided to initiate process to start an insulin pump. PMH significant for T1DM (dx 03/04/20; 10.1% (03/04/20 15:32) --> 9.9% (03/04/20 20:01), insulin ab negative, GAD65 ab positive (5161.1 U/mL), pancreatic islet cell ab elevated (2.5 ng/mL), c-peptide elevated (2.5 ng/mL), no ZnT8 ab or IA-2 ab), insulin resistance, HTN, allergic rhinitis, PCOS, goiter, hyperandrogenism. Of note, patient previously established care with Dr. Vanessa [place] on 02/21/2017 as she had been referred by her primary care provider due to weight gain and A1c of 6.2%; she was being managed for prediabetes (managed with lifestyle changes and use of metformin) but then patient was lost to follow up from Spring 2020 to to Spring 2021 due to the COVID-19 pandemic. Patient manages DM via multiple daily insulin injections and by wearing a Dexcom G6 continuous glucose monitor (CGM).  I connected with Tanya Johnson on 06/04/23 by video and verified that I am speaking with the correct person using two identifiers. ***  Insurance Coverage: [place] Managed Medicaid (Healthy [place])  Preferred Pharmacy [facility] [phone] - [place], [place] - [address], [place] [address] Phone: [phone]  Fax: [phone] DEA #: [id number]   Diabetes Medication Regimen  -Basal insulin: Evaristo Bury Flextouch pen (100 units/mL; 1 unit increments) 26 units daily -Bolus insulin: Novolog Flexpen disposable pen (1 unit increments) (using dosing charts) --ICR: 5 (day), 8 (bed) --ISF: 30 (day), 40  (bed) --Target BG: 120 mg/dL (day) and 664 mg/dL (bed)   Pre-pump Topics Insulin Pump Options Insulin Pump Basics Failed Infusion Set Site Management Sick Day Management Pump Failure Travel  Pump Start Instructions   Prepump Survey Responses Email address: *** Basal Insulin Administration Time: *** pm Number of Units of Bolus Insulin Administered for Breakfast (9-10 am): 12-14 units Number of Units of Bolus Insulin Administered for Lunch (2-3 pm ): 12-14 units Number of Units of Bolus Insulin Administered for Dinner (6-7 pm): 12-14 units  Number of Units of Bolus Insulin Administered for Snack (3-4 pm): 7-8 units Bedtime: *** pm  Labs:    There were no vitals filed for this visit.  HbA1c Lab Results  Component Value Date   HGBA1C 9.4 05/07/2023   HGBA1C 8.7 (A) 08/15/2021   HGBA1C 11.0 (A) 02/21/2021    Pancreatic Islet Cell Autoantibodies Lab Results  Component Value Date   ISLETAB Negative 03/04/2020    Insulin Autoantibodies Lab Results  Component Value Date   INSULINAB <5.0 03/04/2020    Glutamic Acid Decarboxylase Autoantibodies Lab Results  Component Value Date   GLUTAMICACAB >250 (H) 02/21/2021    ZnT8 Autoantibodies No results found for: "ZNT8AB"  IA-2 Autoantibodies No results found for: "LABIA2"  C-Peptide Lab Results  Component Value Date   CPEPTIDE 1.96 04/02/2023    Microalbumin Lab Results  Component Value Date   MICRALBCREAT 7 01/09/2018    Lipids    Component Value Date/Time   CHOL 216 (H) 04/02/2023 1127   CHOL 211 (H) 09/27/2018 1430   TRIG 97 (H) 04/02/2023 1127   HDL 46 04/02/2023 1127  HDL 37 (L) 09/27/2018 1430   CHOLHDL 4.7 04/02/2023 1127   LDLCALC 149 (H) 04/02/2023 1127    Assessment: Education - Thoroughly discussed all pre-pump topics (insulin pump options, insulin pump basics, failed infusion set site management, sick day management, pump failure, travel, and pump start instructions).   Pump Start  Instructions - Patient has decided to start the {Pump Name:29565} insulin pump. Sent prescription for Novolog vial to patient's preferred pharmacy. The patient/family understand that the family should bring all insulin pump supplies as well as insulin vial to pump start appointment. Advised patient to {STOP Basal Insulin:29567}  Plan: Taper basal insulin prior to pump start appointment as instructed  Obtain Novolog vial from pharmacy and bring to pump training appointment Obtain pump supplies and bring to pump training appointment Follow Up: ***  Patient instructions emailed to *** and Mychart message sent if patient has mychart.  Hello!  I have attached the prepump powerpoint.  Hi!  I entered your order for the {Pump Therapy Option:29564}  Please remember to do the following BEFORE your pump start appointment  You must {STOP Basal Insulin:29567} Bring pump supplies that you will get through the {Pump Supplies:29568} Bring vial of  Novolog from the pharmacy  Please contact office at [phone] with any questions or concerns  Thanks!  Tanya Johnson  This appointment required *** minutes of patient care (this includes precharting, chart review, review of virtual, virtual care, etc.).  Thank you for involving clinical pharmacist/diabetes educator to assist in providing this patient's care.  Zachery Conch, PharmD, BCACP, CDCES, CPP

## 2023-06-04 NOTE — Telephone Encounter (Signed)
Called patient at [phone] three separate times on 06/04/2023 at 1:51 PM. Unable to leave HIPAA-compliant voicemail with instructions to call [facility] back.  "Your call cannot be completed as dialed. Please consult your directory and call again or ask your operator for assistance"  Will contact patient via Mychart regarding appointment and rescheduling (appears she last logged in 06/03/23)  Thank you for involving pharmacy/diabetes educator to assist in providing this patient's care.   Zachery Conch, PharmD, BCACP, CDCES, CPP

## 2023-06-07 ENCOUNTER — Encounter (INDEPENDENT_AMBULATORY_CARE_PROVIDER_SITE_OTHER): Payer: Self-pay | Admitting: Licensed Clinical Social Worker

## 2023-06-11 NOTE — Progress Notes (Deleted)
This is a Pediatric Specialist E-Visit (My Chart Video Visit) follow up consult provided via telephone [name] consented to an E-Visit consult today Location of patient: Tanya Johnson are at home  Location of provider: Zachery Conch, PharmD, BCACP, CDCES, CPP is working remotely  S:     No chief complaint on file.   Endocrinology provider: Dessa Phi, MD  Patient has decided to initiate process to start an insulin pump. PMH significant for T1DM (dx 03/04/20; 10.1% (03/04/20 15:32) --> 9.9% (03/04/20 20:01), insulin ab negative, GAD65 ab positive (5161.1 U/mL), pancreatic islet cell ab elevated (2.5 ng/mL), c-peptide elevated (2.5 ng/mL), no ZnT8 ab or IA-2 ab), insulin resistance, HTN, allergic rhinitis, PCOS, goiter, hyperandrogenism. Of note, patient previously established care with Dr. Vanessa Altona on 02/21/2017 as she had been referred by her primary care provider due to weight gain and A1c of 6.2%; she was being managed for prediabetes (managed with lifestyle changes and use of metformin) but then patient was lost to follow up from Spring 2020 to to Spring 2021 due to the COVID-19 pandemic. Patient manages DM via multiple daily insulin injections and by wearing a Dexcom G6 continuous glucose monitor (CGM).  I connected with [name] on *** by telephone  and verified that I am speaking with the correct person using two identifiers. ***  Insurance Coverage: [place] Man[place] Medicaid ([employer] [place])  Preferred Pharmacy [facility] [phone] - [place], [place] - [address], [place] [address] Phone: [phone]  Fax: [phone] DEA #: [id number]   Diabetes Medication Regimen  -Basal insulin: Evaristo Bury Flextouch pen (100 units/mL; 1 unit increments) 26 units daily -Bolus insulin: Novolog Flexpen disposable pen (1 unit increments) (using dosing charts) --ICR: 5 (day), 8 (bed) --ISF: 30 (day), 40  (bed) --Target BG: 120 mg/dL (day) and 564 mg/dL (bed)   Pre-pump Topics Insulin Pump Options Insulin Pump Basics Failed Infusion Set Site Management Sick Day Management Pump Failure Travel  Pump Start Instructions   Prepump Survey Responses Email address: *** Basal Insulin Administration Time: *** pm Number of Units of Bolus Insulin Administered for Breakfast (9-10 am): 12-14 units Number of Units of Bolus Insulin Administered for Lunch (2-3 pm ): 12-14 units Number of Units of Bolus Insulin Administered for Dinner (6-7 pm): 12-14 units  Number of Units of Bolus Insulin Administered for Snack (3-4 pm): 7-8 units Bedtime: *** pm  Labs:    There were no vitals filed for this visit.  HbA1c Lab Results  Component Value Date   HGBA1C 9.4 05/07/2023   HGBA1C 8.7 (A) 08/15/2021   HGBA1C 11.0 (A) 02/21/2021    Pancreatic Islet Cell Autoantibodies Lab Results  Component Value Date   ISLETAB Negative 03/04/2020    Insulin Autoantibodies Lab Results  Component Value Date   INSULINAB <5.0 03/04/2020    Glutamic Acid Decarboxylase Autoantibodies Lab Results  Component Value Date   GLUTAMICACAB >250 (H) 02/21/2021    ZnT8 Autoantibodies No results found for: "ZNT8AB"  IA-2 Autoantibodies No results found for: "LABIA2"  C-Peptide Lab Results  Component Value Date   CPEPTIDE 1.96 04/02/2023    Microalbumin Lab Results  Component Value Date   MICRALBCREAT 7 01/09/2018    Lipids    Component Value Date/Time   CHOL 216 (H) 04/02/2023 1127   CHOL 211 (H) 09/27/2018 1430   TRIG 97 (H) 04/02/2023 1127   HDL 46 04/02/2023 1127  HDL 37 (L) 09/27/2018 1430   CHOLHDL 4.7 04/02/2023 1127   LDLCALC 149 (H) 04/02/2023 1127    Assessment: Education - Thoroughly discussed all pre-pump topics (insulin pump options, insulin pump basics, failed infusion set site management, sick day management, pump failure, travel, and pump start instructions).   Pump Start  Instructions - Patient has decided to start the {Pump Name:29565} insulin pump. Sent prescription for Novolog vial to patient's preferred pharmacy. The patient/family understand that the family should bring all insulin pump supplies as well as insulin vial to pump start appointment. Advised patient to {STOP Basal Insulin:29567}  Plan: Taper basal insulin prior to pump start appointment as instructed  Obtain Novolog vial from pharmacy and bring to pump training appointment Obtain pump supplies and bring to pump training appointment Follow Up: ***  Patient instructions emailed to *** and Mychart message sent if patient has mychart.  Hello!  I have attached the prepump powerpoint.  Hi!  I entered your order for the {Pump Therapy Option:29564}  Please remember to do the following BEFORE your pump start appointment  You must {STOP Basal Insulin:29567} Bring pump supplies that you will get through the {Pump Supplies:29568} Bring vial of  Novolog from the pharmacy  Please contact office at [phone] with any questions or concerns  Thanks!  Delainee Tramel  This appointment required *** minutes of patient care (this includes precharting, chart review, review of virtual, virtual care, etc.).  Thank you for involving clinical pharmacist/diabetes educator to assist in providing this patient's care.  Zachery Conch, PharmD, BCACP, CDCES, CPP

## 2023-06-12 ENCOUNTER — Telehealth (INDEPENDENT_AMBULATORY_CARE_PROVIDER_SITE_OTHER): Payer: Self-pay | Admitting: Pharmacist

## 2023-06-12 ENCOUNTER — Encounter (INDEPENDENT_AMBULATORY_CARE_PROVIDER_SITE_OTHER): Payer: Self-pay

## 2023-06-12 ENCOUNTER — Encounter (INDEPENDENT_AMBULATORY_CARE_PROVIDER_SITE_OTHER): Payer: Self-pay | Admitting: Pharmacist

## 2023-06-12 ENCOUNTER — Ambulatory Visit (INDEPENDENT_AMBULATORY_CARE_PROVIDER_SITE_OTHER): Payer: Self-pay | Admitting: Pharmacist

## 2023-06-12 NOTE — Telephone Encounter (Signed)
Called patient on 06/12/2023. Attempted to leave HIPAA-compliant voicemail with instructions to call [facility] back.  Plan to discuss appointment scheduled today at 10:30 am. [facility] message. Will await for patient to return call at this time.  Thank you for involving pharmacy/diabetes educator to assist in providing this patient's care.   Zachery Conch, PharmD, BCACP, CDCES, CPP

## 2023-06-13 ENCOUNTER — Encounter: Payer: Self-pay | Admitting: Orthopaedic Surgery

## 2023-06-13 ENCOUNTER — Ambulatory Visit: Payer: Medicaid Other | Admitting: Orthopaedic Surgery

## 2023-06-13 VITALS — BP 134/79 | HR 105 | Ht 63.0 in | Wt 310.0 lb

## 2023-06-13 DIAGNOSIS — Z6841 Body Mass Index (BMI) 40.0 and over, adult: Secondary | ICD-10-CM

## 2023-06-13 DIAGNOSIS — M25561 Pain in right knee: Secondary | ICD-10-CM | POA: Diagnosis not present

## 2023-06-13 MED ORDER — NAPROXEN 500 MG PO TABS: 500.0000 mg | ORAL_TABLET | Freq: Two times a day (BID) | ORAL | 5 refills | Status: AC

## 2023-06-13 NOTE — Progress Notes (Signed)
Subjective:    Patient ID: Tanya Johnson, female    DOB: 09/19/2006, 17 y.o.   MRN: [id number]  HPI She hurt her right knee in a fall from a zip line on 06-07-23.  She only fell a few feet.  She had pain in the right knee at the time.  She felt like her knee cap moved outward but popped back in.  She was seen for this at [facility].  She was told to come here.  She is wearing a brace from a family member and using a cane.  She is not improving.  She has no other injury.  I have independently reviewed and interpreted x-rays of this patient done at another site by another physician or qualified health professional.    Review of Systems  Constitutional:  Positive for activity change.  Respiratory:  Positive for shortness of breath and wheezing.   Musculoskeletal:  Positive for arthralgias, gait problem and joint swelling.  Allergic/Immunologic: Positive for environmental allergies.  All other systems reviewed and are negative. For Review of Systems, all other systems reviewed and are negative.  The following is a summary of the past history medically, past history surgically, known current medicines, social history and family history.  This information is gathered electronically by the computer from prior information and documentation.  I review this each visit and have found including this information at this point in the chart is beneficial and informative.   Past Medical History:  Diagnosis Date   Abnormal food appetite 02/21/2017   Allergic rhinitis 09/05/2013   Elevated transaminase level 03/04/2020   Hypertension    Obesity, unspecified 09/05/2013   Prediabetes 03/27/2014   Hgb A1C 5.9%     Unspecified asthma(493.90) 09/05/2013    Past Surgical History:  Procedure Laterality Date   TONSILLECTOMY     TYMPANOSTOMY TUBE PLACEMENT      Current Outpatient Medications on File Prior to Visit  Medication Sig Dispense Refill   Accu-Chek FastClix Lancets MISC Check  sugar up to 6 times daily. For use with FAST CLIX Lancet Device 204 each 3   BD PEN NEEDLE MICRO U/F 32G X 6 MM MISC USE TO INJECT INSULIN 6 TIMES A DAILY 200 each 5   BD PEN NEEDLE NANO 2ND GEN 32G X 4 MM MISC USE TO INJECT INSULIN 6 TIMES DAILY 200 each 5   Continuous Blood Gluc Receiver (DEXCOM G6 RECEIVER) DEVI Use with Dexcom Sensor and Transmitter to check Blood Sugars 1 each 0   Continuous Blood Gluc Transmit (DEXCOM G6 TRANSMITTER) MISC Use with Dexcom Sensor, reuse transmitter for 3 months. 1 each 1   Continuous Glucose Sensor (DEXCOM G7 SENSOR) MISC Use 1 sensor as directed every 10 days to monitor glucose continuously. 3 each 5   Continuous Glucose Transmitter (DEXCOM G6 TRANSMITTER) MISC Use 1 transmitter as directed every 90 days 1 each 1   fluticasone (FLONASE) 50 MCG/ACT nasal spray Place 1 spray into both nostrils daily. 16 g 12   Glucagon (BAQSIMI TWO PACK) 3 MG/DOSE POWD Place 1 each into the nose as needed (severe hypoglycmia with unresponsiveness). 1 each 3   glucose blood (ACCU-CHEK GUIDE) test strip USE TO CHECK BLOOD SUGAR SIX TIMES DAILY 200 strip 5   Glucose Blood (IGLUCOSE TEST STRIPS VI)      insulin aspart (NOVOLOG) 100 UNIT/ML injection Inject up to 200 units into insulin pump as directed every 2-3 days. Please fill for VIAL. 30 mL 5  Insulin Aspart FlexPen (NOVOLOG) 100 UNIT/ML ADMINISTER UP TO 100 UNITS UNDER THE SKIN EVERY DAY AS DIRECTED BY PRESCRIBER 30 mL 5   Lancets Misc. (ACCU-CHEK FASTCLIX LANCET) KIT Check sugar 6 times daily 1 kit 1   loratadine (CLARITIN) 10 MG tablet Take 1 tablet (10 mg total) by mouth daily. 30 tablet 11   metFORMIN (GLUCOPHAGE-XR) 500 MG 24 hr tablet TAKE 1 TABLET(500 MG) BY MOUTH IN THE MORNING AND AT BEDTIME 60 tablet 5   montelukast (SINGULAIR) 4 MG chewable tablet Chew 1 tablet (4 mg total) by mouth at bedtime. 30 tablet 6   Ostomy Supplies (SKIN TAC ADHESIVE BARRIER WIPE) MISC For use with Dexcom 50 each 1   TRESIBA FLEXTOUCH 100  UNIT/ML FlexTouch Pen ADMINISTER UP TO 50 UNITS UNDER THE SKIN DAILY AS DIRECTED 15 mL 5   acetone, urine, test strip Check ketones per protocol (Patient not taking: Reported on 04/16/2023) 50 each 3   lisinopril (ZESTRIL) 20 MG tablet Take by mouth.     No current facility-administered medications on file prior to visit.    Social History   Socioeconomic History   Marital status: Single    Spouse name: Not on file   Number of children: Not on file   Years of education: Not on file   Highest education level: Not on file  Occupational History   Not on file  Tobacco Use   Smoking status: Never    Passive exposure: Yes   Smokeless tobacco: Never   Tobacco comments:    mom smokes  Vaping Use   Vaping status: Never Used  Substance and Sexual Activity   Alcohol use: Never   Drug use: Not Currently    Types: Marijuana    Comment: has tried once in the past   Sexual activity: Not Currently    Birth control/protection: None    Comment: lost her virginity when she was 10 per pt  Other Topics Concern   Not on file  Social History Narrative   Lives with dad, PGF and PGM and Paternal Aunt, brother age 30 year old   Goes to [place] on weekends. Sisters 3 and 2 live with mom      2018 lives with mom and stepdad,and siblings  mom smokes   Visits dad on weekends.      She is in 9th grade       she gets A's and B's. She enjoys playing outside, listening to music, and drawing.       2021: stays with grandmother most of the time, but also sometimes stays with mom      2022: 10th grade at [employer].       2024: [employer] 12th grade 23-24 school year   Social Determinants of Health   Financial Resource Strain: Not on file  Food Insecurity: Not on file  Transportation Needs: Not on file  Physical Activity: Not on file  Stress: Not on file  Social Connections: Not on file  Intimate Partner Violence: Not on file    Family History  Problem Relation  Age of Onset   Diabetes Mother    Hypertension Mother    Asthma Sister    Obesity Paternal Aunt    Obesity Paternal Grandmother    Diabetes Paternal Grandmother    Hypertension Paternal Grandmother    Diabetes Paternal Grandfather    Heart disease Paternal Grandfather    Hypertension Paternal Grandfather    Obesity Paternal Actor  Bronchitis Sister     BP 134/79   Pulse (!) 105   Ht 5\' 3"  (1.6 m)   Wt (!) 310 lb (140.6 kg)   BMI 54.91 kg/m   Body mass index is 54.91 kg/m.      Objective:   Physical Exam Vitals and nursing note reviewed. Exam conducted with a chaperone present.  Constitutional:      Appearance: She is well-developed.  HENT:     Head: Normocephalic and atraumatic.  Eyes:     Conjunctiva/sclera: Conjunctivae normal.     Pupils: Pupils are equal, round, and reactive to light.  Cardiovascular:     Rate and Rhythm: Normal rate and regular rhythm.  Pulmonary:     Effort: Pulmonary effort is normal.  Abdominal:     Palpations: Abdomen is soft.  Musculoskeletal:     Cervical back: Normal range of motion and neck supple.       Legs:  Skin:    General: Skin is warm and dry.  Neurological:     Mental Status: She is alert and oriented to person, place, and time.     Cranial Nerves: No cranial nerve deficit.     Motor: No abnormal muscle tone.     Coordination: Coordination normal.     Deep Tendon Reflexes: Reflexes are normal and symmetric. Reflexes normal.  Psychiatric:        Behavior: Behavior normal.        Thought Content: Thought content normal.        Judgment: Judgment normal.           Assessment & Plan:   Encounter Diagnoses  Name Primary?   Acute pain of right knee Yes   Body mass index 50.0-59.9, adult (HCC)    Morbid obesity (HCC)    I am concerned about meniscus tear.    I will begin Naprosyn 500 po bid pc.  I will give knee brace.  Use the cane.  Return in one week.  She may need MRI.  Call if any  problem.  Precautions discussed.  \Electronically Signed Darreld Mclean, MD 7/17/202410:46 AM

## 2023-06-14 ENCOUNTER — Encounter (INDEPENDENT_AMBULATORY_CARE_PROVIDER_SITE_OTHER): Payer: Self-pay

## 2023-06-20 ENCOUNTER — Encounter: Payer: Self-pay | Admitting: Orthopaedic Surgery

## 2023-06-20 ENCOUNTER — Ambulatory Visit (INDEPENDENT_AMBULATORY_CARE_PROVIDER_SITE_OTHER): Payer: Medicaid Other | Admitting: Orthopaedic Surgery

## 2023-06-20 VITALS — BP 134/79 | Ht 63.0 in | Wt 310.0 lb

## 2023-06-20 DIAGNOSIS — M25561 Pain in right knee: Secondary | ICD-10-CM | POA: Diagnosis not present

## 2023-06-20 DIAGNOSIS — Z6841 Body Mass Index (BMI) 40.0 and over, adult: Secondary | ICD-10-CM

## 2023-06-20 NOTE — Progress Notes (Signed)
I am better.  She has been using her knee brace. She has less pain and less swelling.  She has no new trauma.  Right knee has less effusion and near full ROM.  She is walking better but still has a very slight limp to the right.  Knee is stable.  NV intact.  Encounter Diagnoses  Name Primary?   Acute pain of right knee Yes   Body mass index 50.0-59.9, adult (HCC)    Morbid obesity (HCC)    Continue the brace.   Call if any problem.  Precautions discussed.  Return in two weeks.  Electronically Signed Darreld Mclean, MD 7/24/20248:38 AM

## 2023-07-04 ENCOUNTER — Ambulatory Visit: Payer: Medicaid Other | Admitting: Orthopaedic Surgery

## 2023-07-11 ENCOUNTER — Encounter: Payer: Self-pay | Admitting: Orthopaedic Surgery

## 2023-07-11 ENCOUNTER — Ambulatory Visit (INDEPENDENT_AMBULATORY_CARE_PROVIDER_SITE_OTHER): Payer: Medicaid Other | Admitting: Orthopaedic Surgery

## 2023-07-11 VITALS — BP 135/86 | HR 80 | Ht 63.0 in | Wt 324.0 lb

## 2023-07-11 DIAGNOSIS — M25561 Pain in right knee: Secondary | ICD-10-CM

## 2023-07-11 NOTE — Patient Instructions (Signed)
While we are working on your approval for MRI please go ahead and call to schedule your appointment with [facility] within at least one (1) week.   Central Scheduling [phone]  

## 2023-07-11 NOTE — Progress Notes (Signed)
My knee is still hurting some  She has less pain in the right knee.  She has less swelling but it hurts and she has some giving way.  She has used a brace.  ROM of the right knee is 0 to 105, limp right, crepitus, effusion, positive medial [place], NV intact, no distal edema.  Encounter Diagnosis  Name Primary?   Acute pain of right knee Yes   I would like to get MRI.  Continue her brace.  Return in three weeks.  Call if any problem.  Precautions discussed.  Electronically Signed Darreld Mclean, MD 8/14/202411:07 AM

## 2023-07-27 ENCOUNTER — Ambulatory Visit (HOSPITAL_COMMUNITY): Payer: Medicaid Other

## 2023-08-01 ENCOUNTER — Ambulatory Visit: Payer: Medicaid Other | Admitting: Orthopaedic Surgery

## 2023-08-06 ENCOUNTER — Ambulatory Visit (HOSPITAL_COMMUNITY)
Admission: RE | Admit: 2023-08-06 | Discharge: 2023-08-06 | Disposition: A | Discharge status: Home / Self Care | Payer: Medicaid Other | Source: Ambulatory Visit | Attending: Orthopaedic Surgery | Admitting: Orthopaedic Surgery

## 2023-08-06 DIAGNOSIS — M25561 Pain in right knee: Secondary | ICD-10-CM | POA: Insufficient documentation

## 2023-08-09 ENCOUNTER — Encounter: Payer: Self-pay | Admitting: *Deleted

## 2023-08-13 ENCOUNTER — Ambulatory Visit (INDEPENDENT_AMBULATORY_CARE_PROVIDER_SITE_OTHER): Payer: Self-pay | Admitting: Pediatric Endocrinology

## 2023-09-18 ENCOUNTER — Ambulatory Visit: Payer: Medicaid Other | Admitting: Orthopaedic Surgery

## 2023-09-18 ENCOUNTER — Telehealth: Payer: Self-pay

## 2023-09-18 NOTE — Telephone Encounter (Signed)
LVM letting patient know per Dr Hilda Lias  she is to see Dr Romeo Apple due to having a Abnormal result of her MRI of the right knee

## 2023-09-19 ENCOUNTER — Telehealth: Payer: Self-pay

## 2023-09-19 NOTE — Telephone Encounter (Signed)
LVM for patient to call me back need to refer her to see Dr Romeo Apple Per Dr Hilda Lias

## 2023-09-20 ENCOUNTER — Telehealth: Payer: Self-pay

## 2023-09-20 NOTE — Telephone Encounter (Signed)
Spoke to patient and let her know she needs to see Dr Romeo Apple per Dr Hilda Lias due to an abnormal MRI

## 2023-09-24 ENCOUNTER — Ambulatory Visit: Payer: Medicaid Other | Admitting: Dietician

## 2023-10-01 ENCOUNTER — Other Ambulatory Visit (INDEPENDENT_AMBULATORY_CARE_PROVIDER_SITE_OTHER): Payer: Self-pay | Admitting: Pediatric Endocrinology

## 2023-10-01 DIAGNOSIS — E119 Type 2 diabetes mellitus without complications: Secondary | ICD-10-CM

## 2023-10-01 DIAGNOSIS — E1065 Type 1 diabetes mellitus with hyperglycemia: Secondary | ICD-10-CM

## 2023-10-09 ENCOUNTER — Encounter (INDEPENDENT_AMBULATORY_CARE_PROVIDER_SITE_OTHER): Payer: Self-pay

## 2023-10-31 ENCOUNTER — Telehealth (INDEPENDENT_AMBULATORY_CARE_PROVIDER_SITE_OTHER): Payer: Self-pay

## 2023-10-31 NOTE — Telephone Encounter (Signed)
 Received fax from pharmacy/covermymeds to complete prior authorization initiated on covermymeds, completed prior authorization Pharmacy would like notification of determination [facility] [phone]

## 2023-11-14 ENCOUNTER — Ambulatory Visit: Payer: Medicaid Other | Admitting: Family Medicine

## 2023-11-20 ENCOUNTER — Telehealth (INDEPENDENT_AMBULATORY_CARE_PROVIDER_SITE_OTHER): Payer: Self-pay | Admitting: Family

## 2023-11-20 DIAGNOSIS — E1065 Type 1 diabetes mellitus with hyperglycemia: Secondary | ICD-10-CM

## 2023-11-20 DIAGNOSIS — E119 Type 2 diabetes mellitus without complications: Secondary | ICD-10-CM

## 2023-11-20 MED ORDER — INSULIN ASPART FLEXPEN 100 UNIT/ML ~~LOC~~ SOPN: PEN_INJECTOR | SUBCUTANEOUS | 2 refills | Status: DC

## 2023-11-20 NOTE — Telephone Encounter (Signed)
Attempted to call Pt, no answer left message to return call

## 2023-11-20 NOTE — Telephone Encounter (Signed)
Spoke with PT, she stated she needed the flex pens she is no longer on the vials. Pt would like her prescriptions sent to [facility] in [place] from now on.

## 2023-11-20 NOTE — Telephone Encounter (Signed)
[facility] Call ID: [id number]  Phone:  [phone]  Caller:   Reason for the Call:  Caller  states her pharmacy is of of stock with her Novalog insulin shots and needs a sample until they come back in stock. Can she get one tomorrow or what should she d? She currently does not have any symptoms and she has enough to last until the office opens tomorrow but does  not want to run out during the holidays.

## 2023-12-03 ENCOUNTER — Telehealth (INDEPENDENT_AMBULATORY_CARE_PROVIDER_SITE_OTHER): Payer: Self-pay | Admitting: Pediatric Endocrinology

## 2023-12-03 ENCOUNTER — Other Ambulatory Visit (INDEPENDENT_AMBULATORY_CARE_PROVIDER_SITE_OTHER): Payer: Self-pay | Admitting: Family

## 2023-12-03 DIAGNOSIS — E1065 Type 1 diabetes mellitus with hyperglycemia: Secondary | ICD-10-CM

## 2023-12-03 NOTE — Telephone Encounter (Signed)
 Referral placed to [facility] since patient is 18 years of age. I called and spoke to patient explaining the referral and provided [facility]'s phone number for her to call and schedule an appointment. She denied needing medication refills. Nothing further needed at this time. Patient stated her understanding. Damien CHRISTELLA Pesa

## 2023-12-12 ENCOUNTER — Encounter (INDEPENDENT_AMBULATORY_CARE_PROVIDER_SITE_OTHER): Payer: Self-pay

## 2023-12-24 ENCOUNTER — Encounter (INDEPENDENT_AMBULATORY_CARE_PROVIDER_SITE_OTHER): Payer: Medicaid Other | Admitting: Family

## 2024-03-04 ENCOUNTER — Encounter (INDEPENDENT_AMBULATORY_CARE_PROVIDER_SITE_OTHER): Payer: Self-pay

## 2024-03-10 ENCOUNTER — Other Ambulatory Visit

## 2024-03-17 ENCOUNTER — Encounter: Payer: Self-pay | Admitting: "Endocrinology

## 2024-03-17 ENCOUNTER — Encounter (INDEPENDENT_AMBULATORY_CARE_PROVIDER_SITE_OTHER): Payer: Self-pay

## 2024-03-17 ENCOUNTER — Other Ambulatory Visit: Payer: Self-pay

## 2024-03-17 ENCOUNTER — Ambulatory Visit (INDEPENDENT_AMBULATORY_CARE_PROVIDER_SITE_OTHER): Admitting: "Endocrinology

## 2024-03-17 VITALS — BP 110/80 | HR 110 | Ht 63.0 in | Wt 348.0 lb

## 2024-03-17 DIAGNOSIS — E1165 Type 2 diabetes mellitus with hyperglycemia: Secondary | ICD-10-CM | POA: Diagnosis not present

## 2024-03-17 DIAGNOSIS — Z794 Long term (current) use of insulin: Secondary | ICD-10-CM | POA: Diagnosis not present

## 2024-03-17 DIAGNOSIS — E782 Mixed hyperlipidemia: Secondary | ICD-10-CM

## 2024-03-17 MED ORDER — BAQSIMI ONE PACK 3 MG/DOSE NA POWD: 1.0000 | NASAL | 3 refills | Status: AC | PRN

## 2024-03-17 MED ORDER — DEXCOM G7 SENSOR MISC: 1.0000 | 3 refills | Status: AC

## 2024-03-17 NOTE — Progress Notes (Unsigned)
 Outpatient Endocrinology Note Jorge Newcomer, MD  03/17/24   Tanya Johnson 2006-08-17 [id number]  Referring Provider: Candee Cha, NP Primary Care Provider: Camilla Cedar, MD Reason for consultation: Subjective   Assessment & Plan  Diagnoses and all orders for this visit:  Uncontrolled type 2 diabetes mellitus with hyperglycemia (HCC) -     Ambulatory referral to diabetic education -     GAD65, IA-2, and Insulin  Autoantibody serum -     Lipid panel -     Microalbumin / creatinine urine ratio -     C-peptide -     COMPLETE METABOLIC PANEL WITH eGFR  Long-term insulin  use (HCC)  Mixed hypercholesterolemia and hypertriglyceridemia  Other orders -     Continuous Glucose Sensor (DEXCOM G7 SENSOR) MISC; 1 Device by Does not apply route continuous. -     Glucagon  (BAQSIMI  ONE PACK) 3 MG/DOSE POWD; Place 1 Device into the nose as needed (Low blood sugar with impaired consciousness).   C-peptide + Diabetes Type II complicated by hyperglycemia, No results found for: "GFR" Hba1c goal less than 7, current Hba1c is  Lab Results  Component Value Date   HGBA1C 9.4 05/07/2023   Will recommend the following: Tresiba  26 units qpm Novolog : 12-15 units 15 min before meals Ordered CGM to help adjust doses as needed Ordered DM education  Has ozempic from pcp  that she has used in 2024, threw up previously but wants to retry   No known contraindications/side effects to any of above medications Glucagon  discussed and prescribed with refills on 03/17/24   No history of MEN syndrome/medullary thyroid cancer/pancreatitis or pancreatic cancer in self or family  -Last LD and Tg are as follows: Lab Results  Component Value Date   LDLCALC 149 (H) 04/02/2023    Lab Results  Component Value Date   TRIG 97 (H) 04/02/2023   -not on statin -Follow low fat diet and exercise   -Blood pressure goal <140/90 - Microalbumin/creatinine goal is < 30 -Last MA/Cr is as  follows: Lab Results  Component Value Date   MICROALBUR 5.9 (H) 03/08/2020   -on ACE/ARB lisinopril 20 mg every day  -diet changes including salt restriction -limit eating outside -counseled BP targets per standards of diabetes care -uncontrolled blood pressure can lead to retinopathy, nephropathy and cardiovascular and atherosclerotic heart disease  Reviewed and counseled on: -A1C target -Blood sugar targets -Complications of uncontrolled diabetes  -Checking blood sugar before meals and bedtime and bring log next visit -All medications with mechanism of action and side effects -Hypoglycemia management: rule of 15's, Glucagon  Emergency Kit and medical alert ID -low-carb low-fat plate-method diet -At least 20 minutes of physical activity per day -Annual dilated retinal eye exam and foot exam -compliance and follow up needs -follow up as scheduled or earlier if problem gets worse  Call if blood sugar is less than 70 or consistently above 250    Take a 15 gm snack of carbohydrate at bedtime before you go to sleep if your blood sugar is less than 100.    If you are going to fast after midnight for a test or procedure, ask your physician for instructions on how to reduce/decrease your insulin  dose.    Call if blood sugar is less than 70 or consistently above 250  -Treating a low sugar by rule of 15  (15 gms of sugar every 15 min until sugar is more than 70) If you feel your sugar is low, test your sugar to  be sure If your sugar is low (less than 70), then take 15 grams of a fast acting Carbohydrate (3-4 glucose tablets or glucose gel or 4 ounces of juice or regular soda) Recheck your sugar 15 min after treating low to make sure it is more than 70 If sugar is still less than 70, treat again with 15 grams of carbohydrate          Don't drive the hour of hypoglycemia  If unconscious/unable to eat or drink by mouth, use glucagon  injection or nasal spray baqsimi  and call 911. Can repeat  again in 15 min if still unconscious.  Return in about 1 week (around 03/24/2024) for visit and 8 am labs before next visit.   I have reviewed current medications, nurse's notes, allergies, vital signs, past medical and surgical history, family medical history, and social history for this encounter. Counseled patient on symptoms, examination findings, lab findings, imaging results, treatment decisions and monitoring and prognosis. The patient understood the recommendations and agrees with the treatment plan. All questions regarding treatment plan were fully answered.  Jorge Newcomer, MD  03/17/24    History of Present Illness Tanya Johnson is a 18 y.o. year old female who presents for evaluation of Type II diabetes mellitus.  Tanya Johnson was first diagnosed in 2020.   Diabetes education +  Home diabetes regimen: Tresiba  26 units qpm Novolog  right after each meal: 12-15 units   COMPLICATIONS -  MI/Stroke -  retinopathy -  neuropathy -  nephropathy  SYMPTOMS REVIEWED - Polyuria - Weight loss - Blurred vision  BLOOD SUGAR DATA BG 181-426, checks sparingly   Physical Exam  Ht 5\' 3"  (1.6 m)   Wt (!) 348 lb (157.9 kg)   BMI 61.65 kg/m    Constitutional: well developed, well nourished Head: normocephalic, atraumatic Eyes: sclera anicteric, no redness Neck: supple Lungs: normal respiratory effort Neurology: alert and oriented Skin: dry, no appreciable rashes Musculoskeletal: no appreciable defects Psychiatric: normal mood and affect Diabetic Foot Exam - Simple   Simple Foot Form Diabetic Foot exam was performed with the following findings: Yes 03/17/2024  3:29 PM  Visual Inspection No deformities, no ulcerations, no other skin breakdown bilaterally: Yes Sensation Testing Intact to touch and monofilament testing bilaterally: Yes Pulse Check Posterior Tibialis and Dorsalis pulse intact bilaterally: Yes Comments      Current Medications Patient's  Medications  New Prescriptions   CONTINUOUS GLUCOSE SENSOR (DEXCOM G7 SENSOR) MISC    1 Device by Does not apply route continuous.   GLUCAGON  (BAQSIMI  ONE PACK) 3 MG/DOSE POWD    Place 1 Device into the nose as needed (Low blood sugar with impaired consciousness).  Previous Medications   ACCU-CHEK FASTCLIX LANCETS MISC    USE TO CHECK BLOOD SUGAR SIX TIMES DAILY   ACETONE, URINE, TEST STRIP    Check ketones per protocol   BD PEN NEEDLE MICRO U/F 32G X 6 MM MISC    USE TO INJECT INSULIN  6 TIMES A DAILY   BD PEN NEEDLE NANO 2ND GEN 32G X 4 MM MISC    USE TO INJECT INSULIN  6 TIMES DAILY   CONTINUOUS BLOOD GLUC RECEIVER (DEXCOM G6 RECEIVER) DEVI    Use with Dexcom Sensor and Transmitter to check Blood Sugars   CONTINUOUS BLOOD GLUC TRANSMIT (DEXCOM G6 TRANSMITTER) MISC    Use with Dexcom Sensor, reuse transmitter for 3 months.   CONTINUOUS GLUCOSE SENSOR (DEXCOM G7 SENSOR) MISC    Use 1 sensor as  directed every 10 days to monitor glucose continuously.   CONTINUOUS GLUCOSE TRANSMITTER (DEXCOM G6 TRANSMITTER) MISC    Use 1 transmitter as directed every 90 days   FLUTICASONE  (FLONASE ) 50 MCG/ACT NASAL SPRAY    Place 1 spray into both nostrils daily.   GLUCAGON  (BAQSIMI  TWO PACK) 3 MG/DOSE POWD    Place 1 each into the nose as needed (severe hypoglycmia with unresponsiveness).   GLUCOSE BLOOD (ACCU-CHEK GUIDE) TEST STRIP    USE TO CHECK BLOOD SUGAR SIX TIMES DAILY   GLUCOSE BLOOD (IGLUCOSE TEST STRIPS VI)       INSULIN  ASPART (NOVOLOG ) 100 UNIT/ML INJECTION    Inject up to 200 units into insulin  pump as directed every 2-3 days. Please fill for VIAL.   INSULIN  ASPART FLEXPEN (NOVOLOG ) 100 UNIT/ML    ADMINISTER UP TO 100 UNITS UNDER THE SKIN EVERY DAY AS DIRECTED BY PRESCRIBER   LANCETS MISC. (ACCU-CHEK FASTCLIX LANCET) KIT    Check sugar 6 times daily   LISINOPRIL (ZESTRIL) 20 MG TABLET    Take by mouth.   LORATADINE  (CLARITIN ) 10 MG TABLET    Take 1 tablet (10 mg total) by mouth daily.   METFORMIN   (GLUCOPHAGE -XR) 500 MG 24 HR TABLET    TAKE 1 TABLET(500 MG) BY MOUTH IN THE MORNING AND AT BEDTIME   MONTELUKAST  (SINGULAIR ) 4 MG CHEWABLE TABLET    Chew 1 tablet (4 mg total) by mouth at bedtime.   NAPROXEN  (NAPROSYN ) 500 MG TABLET    Take 1 tablet (500 mg total) by mouth 2 (two) times daily with a meal.   OSTOMY SUPPLIES (SKIN TAC ADHESIVE BARRIER WIPE) MISC    For use with Dexcom   TRESIBA  FLEXTOUCH 100 UNIT/ML FLEXTOUCH PEN    ADMINISTER UP TO 50 UNITS UNDER THE SKIN DAILY AS DIRECTED  Modified Medications   No medications on file  Discontinued Medications   No medications on file    Allergies No Known Allergies  Past Medical History Past Medical History:  Diagnosis Date   Abnormal food appetite 02/21/2017   Allergic rhinitis 09/05/2013   Elevated transaminase level 03/04/2020   Hypertension    Obesity, unspecified 09/05/2013   Prediabetes 03/27/2014   Hgb A1C 5.9%     Unspecified asthma(493.90) 09/05/2013    Past Surgical History Past Surgical History:  Procedure Laterality Date   TONSILLECTOMY     TYMPANOSTOMY TUBE PLACEMENT      Family History family history includes Asthma in her sister; Bronchitis in her sister; Diabetes in her mother, paternal grandfather, and paternal grandmother; Heart disease in her paternal grandfather; Hypertension in her mother, paternal grandfather, and paternal grandmother; Obesity in her paternal aunt, paternal grandfather, and paternal grandmother.  Social History Social History   Socioeconomic History   Marital status: Single    Spouse name: Not on file   Number of children: Not on file   Years of education: Not on file   Highest education level: Not on file  Occupational History   Not on file  Tobacco Use   Smoking status: Never    Passive exposure: Yes   Smokeless tobacco: Never   Tobacco comments:    mom smokes  Vaping Use   Vaping status: Never Used  Substance and Sexual Activity   Alcohol use: Never   Drug use: Not  Currently    Types: Marijuana    Comment: has tried once in the past   Sexual activity: Not Currently    Birth control/protection: None  Comment: lost her virginity when she was 10 per pt  Other Topics Concern   Not on file  Social History Narrative   Lives with dad, PGF and PGM and Paternal Aunt, brother age 39 year old   Goes to [employer] on weekends. Sisters 3 and 2 live with mom      2018 lives with mom and stepdad,and siblings  mom smokes   Visits dad on weekends.      She is in 9th grade       she gets A's and B's. She enjoys playing outside, listening to music, and drawing.       2021: stays with grandmother most of the time, but also sometimes stays with mom      2022: 10th grade at [employer].       [employer] 12th grade 23-24 school year   Social Drivers of Health   Financial Resource Strain: Not on file  Food Insecurity: Not on file  Transportation Needs: Not on file  Physical Activity: Not on file  Stress: Not on file  Social Connections: Not on file  Intimate Partner Violence: Not on file    Lab Results  Component Value Date   HGBA1C 9.4 05/07/2023   HGBA1C 8.7 (A) 08/15/2021   HGBA1C 11.0 (A) 02/21/2021   Lab Results  Component Value Date   CHOL 216 (H) 04/02/2023   Lab Results  Component Value Date   HDL 46 04/02/2023   Lab Results  Component Value Date   LDLCALC 149 (H) 04/02/2023   Lab Results  Component Value Date   TRIG 97 (H) 04/02/2023   Lab Results  Component Value Date   CHOLHDL 4.7 04/02/2023   Lab Results  Component Value Date   CREATININE 0.78 04/02/2023   No results found for: "GFR" Lab Results  Component Value Date   MICROALBUR 5.9 (H) 03/08/2020      Component Value Date/Time   NA 137 04/02/2023 1127   K 5.3 (H) 04/02/2023 1127   CL 102 04/02/2023 1127   CO2 27 04/02/2023 1127   GLUCOSE 292 (H) 04/02/2023 1127   BUN 12 04/02/2023 1127   CREATININE 0.78 04/02/2023 1127    CALCIUM  9.2 04/02/2023 1127   PROT 6.8 04/02/2023 1127   ALBUMIN 4.1 03/24/2014 0912   AST 14 04/02/2023 1127   ALT 20 04/02/2023 1127   ALKPHOS 205 03/24/2014 0912   BILITOT 0.3 04/02/2023 1127   GFRNONAA NOT CALCULATED 03/07/2020 0451   GFRAA NOT CALCULATED 03/07/2020 0451      Latest Ref Rng & Units 04/02/2023   11:27 AM 03/07/2020    4:51 AM 03/06/2020    6:01 AM  BMP  Glucose 65 - 99 mg/dL 161  096  045   BUN 7 - 20 mg/dL 12  6  9    Creatinine 0.50 - 0.96 mg/dL 4.09  8.11  9.14   BUN/Creat Ratio 6 - 22 (calc) SEE NOTE:     Sodium 135 - 146 mmol/L 137  138  137   Potassium 3.8 - 5.1 mmol/L 5.3  3.4  3.8   Chloride 98 - 110 mmol/L 102  108  107   CO2 20 - 32 mmol/L 27  20  20    Calcium  8.9 - 10.4 mg/dL 9.2  8.5  8.6        Component Value Date/Time   WBC 12.8 03/02/2020 2214   RBC 5.16 03/02/2020 2214   HGB 14.7 (  A) 03/25/2020 1529   HGB 14.3 03/04/2020 2244   HGB 13.5 06/17/2019 1602   HCT 42.0 03/04/2020 2244   HCT 39.8 06/17/2019 1602   PLT 397 03/02/2020 2214   PLT 416 06/17/2019 1602   MCV 85.5 03/02/2020 2214   MCV 85 06/17/2019 1602   MCH 28.1 03/02/2020 2214   MCHC 32.9 03/02/2020 2214   RDW 12.4 03/02/2020 2214   RDW 13.1 06/17/2019 1602   LYMPHSABS 3.7 03/02/2020 2214   LYMPHSABS 2.9 06/17/2019 1602   MONOABS 0.8 03/02/2020 2214   EOSABS 0.1 03/02/2020 2214   EOSABS 0.2 06/17/2019 1602   BASOSABS 0.1 03/02/2020 2214   BASOSABS 0.0 06/17/2019 1602     Parts of this note may have been dictated using voice recognition software. There may be variances in spelling and vocabulary which are unintentional. Not all errors are proofread. Please notify the [facility] if any discrepancies are noted or if the meaning of any statement is not clear.

## 2024-03-17 NOTE — Patient Instructions (Signed)

## 2024-03-24 ENCOUNTER — Ambulatory Visit (INDEPENDENT_AMBULATORY_CARE_PROVIDER_SITE_OTHER): Admitting: "Endocrinology

## 2024-03-24 ENCOUNTER — Encounter: Payer: Self-pay | Admitting: "Endocrinology

## 2024-03-24 VITALS — BP 110/80 | HR 98 | Ht 63.0 in

## 2024-03-24 DIAGNOSIS — E1165 Type 2 diabetes mellitus with hyperglycemia: Secondary | ICD-10-CM

## 2024-03-24 DIAGNOSIS — Z794 Long term (current) use of insulin: Secondary | ICD-10-CM | POA: Diagnosis not present

## 2024-03-24 DIAGNOSIS — E782 Mixed hyperlipidemia: Secondary | ICD-10-CM

## 2024-03-24 LAB — POCT GLYCOSYLATED HEMOGLOBIN (HGB A1C): Hemoglobin A1C: 11.7 % — AB (ref 4.0–5.6)

## 2024-03-24 NOTE — Progress Notes (Addendum)
 Outpatient Endocrinology Note Jorge Newcomer, MD  03/24/24   Tanya Johnson 2006-04-12 [id number]  Referring Provider: Camilla Cedar, MD Primary Care Provider: Camilla Cedar, MD Reason for consultation: Subjective   Assessment & Plan  Diagnoses and all orders for this visit:  Uncontrolled type 2 diabetes mellitus with hyperglycemia (HCC) -     POCT glycosylated hemoglobin (Hb A1C)  Long-term insulin  use (HCC)  Mixed hypercholesterolemia and hypertriglyceridemia   C-peptide + Diabetes Type II complicated by hyperglycemia, No results found for: "GFR" Hba1c goal less than 7, current Hba1c is  Lab Results  Component Value Date   HGBA1C 11.7 (A) 03/24/2024   Will recommend the following: Tresiba  35 units qpm. Increase by 1 unit every day until fasting blood sugar is less than 150. Stay on that dose.   Novolog : 18-20 units 15 min before meals Ordered DM education previously   Has ozempic from pcp  that she has used in 2024, threw up previously but wants to retry   No known contraindications/side effects to any of above medications Glucagon  discussed and prescribed with refills on 03/17/24   No history of MEN syndrome/medullary thyroid cancer/pancreatitis or pancreatic cancer in self or family  -Last LD and Tg are as follows: Lab Results  Component Value Date   LDLCALC 149 (H) 04/02/2023    Lab Results  Component Value Date   TRIG 97 (H) 04/02/2023   -not on statin -Follow low fat diet and exercise   -Blood pressure goal <140/90 - Microalbumin/creatinine goal is < 30 -Last MA/Cr is as follows: Lab Results  Component Value Date   MICROALBUR 5.9 (H) 03/08/2020   -on ACE/ARB lisinopril 20 mg every day  -diet changes including salt restriction -limit eating outside -counseled BP targets per standards of diabetes care -uncontrolled blood pressure can lead to retinopathy, nephropathy and cardiovascular and atherosclerotic heart disease  Reviewed and  counseled on: -A1C target -Blood sugar targets -Complications of uncontrolled diabetes  -Checking blood sugar before meals and bedtime and bring log next visit -All medications with mechanism of action and side effects -Hypoglycemia management: rule of 15's, Glucagon  Emergency Kit and medical alert ID -low-carb low-fat plate-method diet -At least 20 minutes of physical activity per day -Annual dilated retinal eye exam and foot exam -compliance and follow up needs -follow up as scheduled or earlier if problem gets worse  Call if blood sugar is less than 70 or consistently above 250    Take a 15 gm snack of carbohydrate at bedtime before you go to sleep if your blood sugar is less than 100.    If you are going to fast after midnight for a test or procedure, ask your physician for instructions on how to reduce/decrease your insulin  dose.    Call if blood sugar is less than 70 or consistently above 250  -Treating a low sugar by rule of 15  (15 gms of sugar every 15 min until sugar is more than 70) If you feel your sugar is low, test your sugar to be sure If your sugar is low (less than 70), then take 15 grams of a fast acting Carbohydrate (3-4 glucose tablets or glucose gel or 4 ounces of juice or regular soda) Recheck your sugar 15 min after treating low to make sure it is more than 70 If sugar is still less than 70, treat again with 15 grams of carbohydrate          Don't drive the hour of hypoglycemia  If unconscious/unable to eat or drink by mouth, use glucagon  injection or nasal spray baqsimi  and call 911. Can repeat again in 15 min if still unconscious.  Return in about 15 days (around 04/08/2024) for tele-visit .   I have reviewed current medications, nurse's notes, allergies, vital signs, past medical and surgical history, family medical history, and social history for this encounter. Counseled patient on symptoms, examination findings, lab findings, imaging results, treatment  decisions and monitoring and prognosis. The patient understood the recommendations and agrees with the treatment plan. All questions regarding treatment plan were fully answered.  Jorge Newcomer, MD  03/24/24    History of Present Illness Tanya Johnson is a 18 y.o. year old female who presents for evaluation of Type II diabetes mellitus.  Tanya Johnson was first diagnosed in 2020.   Diabetes education +  Home diabetes regimen: Tresiba  26 units qpm Novolog : 15 units 15 min before meals  COMPLICATIONS -  MI/Stroke -  retinopathy -  neuropathy -  nephropathy  SYMPTOMS REVIEWED - Polyuria - Weight loss - Blurred vision  BLOOD SUGAR DATA  CGM interpretation: At today's visit, we reviewed her CGM downloads. The full report is scanned in the media. Reviewing the CGM trends, BG are  elevated across the day (lows are false per pt due to CGM compression during sleep).  Physical Exam  BP 110/80   Pulse 98   Ht 5\' 3"  (1.6 m)   SpO2 98%   BMI 61.65 kg/m    Constitutional: well developed, well nourished Head: normocephalic, atraumatic Eyes: sclera anicteric, no redness Neck: supple Lungs: normal respiratory effort Neurology: alert and oriented Skin: dry, no appreciable rashes Musculoskeletal: no appreciable defects Psychiatric: normal mood and affect Diabetic Foot Exam - Simple   No data filed      Current Medications Patient's Medications  New Prescriptions   No medications on file  Previous Medications   ACCU-CHEK FASTCLIX LANCETS MISC    USE TO CHECK BLOOD SUGAR SIX TIMES DAILY   ACETONE, URINE, TEST STRIP    Check ketones per protocol   BD PEN NEEDLE MICRO U/F 32G X 6 MM MISC    USE TO INJECT INSULIN  6 TIMES A DAILY   BD PEN NEEDLE NANO 2ND GEN 32G X 4 MM MISC    USE TO INJECT INSULIN  6 TIMES DAILY   CONTINUOUS BLOOD GLUC RECEIVER (DEXCOM G6 RECEIVER) DEVI    Use with Dexcom Sensor and Transmitter to check Blood Sugars   CONTINUOUS BLOOD GLUC  TRANSMIT (DEXCOM G6 TRANSMITTER) MISC    Use with Dexcom Sensor, reuse transmitter for 3 months.   CONTINUOUS GLUCOSE SENSOR (DEXCOM G7 SENSOR) MISC    Use 1 sensor as directed every 10 days to monitor glucose continuously.   CONTINUOUS GLUCOSE SENSOR (DEXCOM G7 SENSOR) MISC    1 Device by Does not apply route continuous.   CONTINUOUS GLUCOSE TRANSMITTER (DEXCOM G6 TRANSMITTER) MISC    Use 1 transmitter as directed every 90 days   FLUTICASONE  (FLONASE ) 50 MCG/ACT NASAL SPRAY    Place 1 spray into both nostrils daily.   GLUCAGON  (BAQSIMI  ONE PACK) 3 MG/DOSE POWD    Place 1 Device into the nose as needed (Low blood sugar with impaired consciousness).   GLUCAGON  (BAQSIMI  TWO PACK) 3 MG/DOSE POWD    Place 1 each into the nose as needed (severe hypoglycmia with unresponsiveness).   GLUCOSE BLOOD (ACCU-CHEK GUIDE) TEST STRIP    USE TO CHECK BLOOD SUGAR SIX TIMES  DAILY   GLUCOSE BLOOD (IGLUCOSE TEST STRIPS VI)       INSULIN  ASPART (NOVOLOG ) 100 UNIT/ML INJECTION    Inject up to 200 units into insulin  pump as directed every 2-3 days. Please fill for VIAL.   INSULIN  ASPART FLEXPEN (NOVOLOG ) 100 UNIT/ML    ADMINISTER UP TO 100 UNITS UNDER THE SKIN EVERY DAY AS DIRECTED BY PRESCRIBER   LANCETS MISC. (ACCU-CHEK FASTCLIX LANCET) KIT    Check sugar 6 times daily   LISINOPRIL (ZESTRIL) 20 MG TABLET    Take by mouth.   LORATADINE  (CLARITIN ) 10 MG TABLET    Take 1 tablet (10 mg total) by mouth daily.   METFORMIN  (GLUCOPHAGE -XR) 500 MG 24 HR TABLET    TAKE 1 TABLET(500 MG) BY MOUTH IN THE MORNING AND AT BEDTIME   MONTELUKAST  (SINGULAIR ) 4 MG CHEWABLE TABLET    Chew 1 tablet (4 mg total) by mouth at bedtime.   NAPROXEN  (NAPROSYN ) 500 MG TABLET    Take 1 tablet (500 mg total) by mouth 2 (two) times daily with a meal.   OSTOMY SUPPLIES (SKIN TAC ADHESIVE BARRIER WIPE) MISC    For use with Dexcom   TRESIBA  FLEXTOUCH 100 UNIT/ML FLEXTOUCH PEN    ADMINISTER UP TO 50 UNITS UNDER THE SKIN DAILY AS DIRECTED  Modified  Medications   No medications on file  Discontinued Medications   No medications on file    Allergies No Known Allergies  Past Medical History Past Medical History:  Diagnosis Date   Abnormal food appetite 02/21/2017   Allergic rhinitis 09/05/2013   Elevated transaminase level 03/04/2020   Hypertension    Obesity, unspecified 09/05/2013   Prediabetes 03/27/2014   Hgb A1C 5.9%     Unspecified asthma(493.90) 09/05/2013    Past Surgical History Past Surgical History:  Procedure Laterality Date   TONSILLECTOMY     TYMPANOSTOMY TUBE PLACEMENT      Family History family history includes Asthma in her sister; Bronchitis in her sister; Diabetes in her mother, paternal grandfather, and paternal grandmother; Heart disease in her paternal grandfather; Hypertension in her mother, paternal grandfather, and paternal grandmother; Obesity in her paternal aunt, paternal grandfather, and paternal grandmother.  Social History Social History   Socioeconomic History   Marital status: Single    Spouse name: Not on file   Number of children: Not on file   Years of education: Not on file   Highest education level: Not on file  Occupational History   Not on file  Tobacco Use   Smoking status: Never    Passive exposure: Yes   Smokeless tobacco: Never   Tobacco comments:    mom smokes  Vaping Use   Vaping status: Never Used  Substance and Sexual Activity   Alcohol use: Never   Drug use: Not Currently    Types: Marijuana    Comment: has tried once in the past   Sexual activity: Not Currently    Birth control/protection: None    Comment: lost her virginity when she was 10 per pt  Other Topics Concern   Not on file  Social History Narrative   Lives with dad, PGF and PGM and Paternal Aunt, brother age 56 year old   Goes to [employer] on weekends. Sisters 3 and 2 live with mom      2018 lives with mom and stepdad,and siblings  mom smokes   Visits dad on weekends.      She is in 9th grade  she gets A's and B's. She enjoys playing outside, listening to music, and drawing.       2021: stays with grandmother most of the time, but also sometimes stays with mom      2022: 10th grade at [employer].       [employer] 12th grade 23-24 school year   Social Drivers of Health   Financial Resource Strain: Not on file  Food Insecurity: Not on file  Transportation Needs: Not on file  Physical Activity: Not on file  Stress: Not on file  Social Connections: Not on file  Intimate Partner Violence: Not on file    Lab Results  Component Value Date   HGBA1C 11.7 (A) 03/24/2024   HGBA1C 9.4 05/07/2023   HGBA1C 8.7 (A) 08/15/2021   Lab Results  Component Value Date   CHOL 216 (H) 04/02/2023   Lab Results  Component Value Date   HDL 46 04/02/2023   Lab Results  Component Value Date   LDLCALC 149 (H) 04/02/2023   Lab Results  Component Value Date   TRIG 97 (H) 04/02/2023   Lab Results  Component Value Date   CHOLHDL 4.7 04/02/2023   Lab Results  Component Value Date   CREATININE 0.78 04/02/2023   No results found for: "GFR" Lab Results  Component Value Date   MICROALBUR 5.9 (H) 03/08/2020      Component Value Date/Time   NA 137 04/02/2023 1127   K 5.3 (H) 04/02/2023 1127   CL 102 04/02/2023 1127   CO2 27 04/02/2023 1127   GLUCOSE 292 (H) 04/02/2023 1127   BUN 12 04/02/2023 1127   CREATININE 0.78 04/02/2023 1127   CALCIUM  9.2 04/02/2023 1127   PROT 6.8 04/02/2023 1127   ALBUMIN 4.1 03/24/2014 0912   AST 14 04/02/2023 1127   ALT 20 04/02/2023 1127   ALKPHOS 205 03/24/2014 0912   BILITOT 0.3 04/02/2023 1127   GFRNONAA NOT CALCULATED 03/07/2020 0451   GFRAA NOT CALCULATED 03/07/2020 0451      Latest Ref Rng & Units 04/02/2023   11:27 AM 03/07/2020    4:51 AM 03/06/2020    6:01 AM  BMP  Glucose 65 - 99 mg/dL 161  096  045   BUN 7 - 20 mg/dL 12  6  9    Creatinine 0.50 - 0.96 mg/dL 4.09  8.11  9.14   BUN/Creat Ratio  6 - 22 (calc) SEE NOTE:     Sodium 135 - 146 mmol/L 137  138  137   Potassium 3.8 - 5.1 mmol/L 5.3  3.4  3.8   Chloride 98 - 110 mmol/L 102  108  107   CO2 20 - 32 mmol/L 27  20  20    Calcium  8.9 - 10.4 mg/dL 9.2  8.5  8.6        Component Value Date/Time   WBC 12.8 03/02/2020 2214   RBC 5.16 03/02/2020 2214   HGB 14.7 (A) 03/25/2020 1529   HGB 14.3 03/04/2020 2244   HGB 13.5 06/17/2019 1602   HCT 42.0 03/04/2020 2244   HCT 39.8 06/17/2019 1602   PLT 397 03/02/2020 2214   PLT 416 06/17/2019 1602   MCV 85.5 03/02/2020 2214   MCV 85 06/17/2019 1602   MCH 28.1 03/02/2020 2214   MCHC 32.9 03/02/2020 2214   RDW 12.4 03/02/2020 2214   RDW 13.1 06/17/2019 1602   LYMPHSABS 3.7 03/02/2020 2214   LYMPHSABS 2.9 06/17/2019 1602   MONOABS  0.8 03/02/2020 2214   EOSABS 0.1 03/02/2020 2214   EOSABS 0.2 06/17/2019 1602   BASOSABS 0.1 03/02/2020 2214   BASOSABS 0.0 06/17/2019 1602     Parts of this note may have been dictated using voice recognition software. There may be variances in spelling and vocabulary which are unintentional. Not all errors are proofread. Please notify the [facility] if any discrepancies are noted or if the meaning of any statement is not clear.

## 2024-03-24 NOTE — Patient Instructions (Signed)

## 2024-03-25 ENCOUNTER — Encounter: Payer: Self-pay | Admitting: "Endocrinology

## 2024-03-26 ENCOUNTER — Other Ambulatory Visit (INDEPENDENT_AMBULATORY_CARE_PROVIDER_SITE_OTHER): Payer: Self-pay | Admitting: Pediatrics

## 2024-03-31 ENCOUNTER — Other Ambulatory Visit (HOSPITAL_COMMUNITY): Payer: Self-pay

## 2024-03-31 ENCOUNTER — Telehealth (INDEPENDENT_AMBULATORY_CARE_PROVIDER_SITE_OTHER): Payer: Self-pay | Admitting: Pharmacy Technician

## 2024-03-31 NOTE — Telephone Encounter (Signed)
 Pharmacy Patient Advocate Encounter   Received notification from CoverMyMeds that prior authorization for Dexcom G7 Sensor is required/requested.   Insurance verification completed.   The patient is insured through [facility] .   Prior Authorization form/request asks a question that requires your assistance. Please see the question below and advise accordingly. The PA will not be submitted until the necessary information is received.   **Does patient still come to this office, or does she see Dr. Vertell Gory now?**

## 2024-03-31 NOTE — Telephone Encounter (Signed)
 Thank you :)

## 2024-04-01 ENCOUNTER — Other Ambulatory Visit (INDEPENDENT_AMBULATORY_CARE_PROVIDER_SITE_OTHER): Payer: Self-pay | Admitting: Pediatric Endocrinology

## 2024-04-01 DIAGNOSIS — E1065 Type 1 diabetes mellitus with hyperglycemia: Secondary | ICD-10-CM

## 2024-04-02 LAB — DIABETES TYPE 1 AUTOANTIBODY PANEL
Glutamic Acid Decarb Ab: >250 [IU]/mL — ABNORMAL HIGH (ref <5)
IA-2 Antibody: 36.6 U/mL — ABNORMAL HIGH (ref <5.4)
Insulin Antibodies, Human: 0.8 U/mL — ABNORMAL HIGH (ref <0.4)
ZINC TRANSPORTER 8 (ZnT8) ANTIBODY: >500 U/mL — ABNORMAL HIGH (ref <15)

## 2024-04-02 LAB — COMPLETE METABOLIC PANEL WITHOUT GFR
AG Ratio: 1.2 (calc) (ref 1.0–2.5)
ALT: 18 U/L (ref 5–32)
AST: 13 U/L (ref 12–32)
Albumin: 3.7 g/dL (ref 3.6–5.1)
Alkaline phosphatase (APISO): 64 U/L (ref 36–128)
BUN: 17 mg/dL (ref 7–20)
CO2: 28 mmol/L (ref 20–32)
Calcium: 9.1 mg/dL (ref 8.9–10.4)
Chloride: 100 mmol/L (ref 98–110)
Creat: 0.76 mg/dL (ref 0.50–0.96)
Globulin: 3 g/dL (ref 2.0–3.8)
Glucose, Bld: 286 mg/dL — ABNORMAL HIGH (ref 65–99)
Potassium: 4.7 mmol/L (ref 3.8–5.1)
Sodium: 136 mmol/L (ref 135–146)
Total Bilirubin: 0.2 mg/dL (ref 0.2–1.1)
Total Protein: 6.7 g/dL (ref 6.3–8.2)

## 2024-04-02 LAB — C-PEPTIDE: C-Peptide: 1.65 ng/mL (ref 0.80–3.85)

## 2024-04-02 LAB — LIPID PANEL
Cholesterol: 219 mg/dL — ABNORMAL HIGH (ref <170)
HDL: 47 mg/dL (ref >45)
LDL Cholesterol (Calc): 156 mg/dL — ABNORMAL HIGH (ref <110)
Non-HDL Cholesterol (Calc): 172 mg/dL — ABNORMAL HIGH (ref <120)
Total CHOL/HDL Ratio: 4.7 (calc) (ref <5.0)
Triglycerides: 67 mg/dL (ref <90)

## 2024-04-02 LAB — MICROALBUMIN / CREATININE URINE RATIO
Creatinine, Urine: 83 mg/dL (ref 20–275)
Microalb Creat Ratio: 2 mg/g{creat} (ref <30)
Microalb, Ur: 0.2 mg/dL

## 2024-04-08 ENCOUNTER — Encounter: Payer: Self-pay | Admitting: "Endocrinology

## 2024-04-08 ENCOUNTER — Telehealth (INDEPENDENT_AMBULATORY_CARE_PROVIDER_SITE_OTHER): Admitting: "Endocrinology

## 2024-04-08 DIAGNOSIS — Z794 Long term (current) use of insulin: Secondary | ICD-10-CM

## 2024-04-08 DIAGNOSIS — Z7985 Long-term (current) use of injectable non-insulin antidiabetic drugs: Secondary | ICD-10-CM

## 2024-04-08 DIAGNOSIS — E7800 Pure hypercholesterolemia, unspecified: Secondary | ICD-10-CM | POA: Diagnosis not present

## 2024-04-08 DIAGNOSIS — E139 Other specified diabetes mellitus without complications: Secondary | ICD-10-CM | POA: Diagnosis not present

## 2024-04-08 DIAGNOSIS — Z7984 Long term (current) use of oral hypoglycemic drugs: Secondary | ICD-10-CM | POA: Diagnosis not present

## 2024-04-08 MED ORDER — PEN NEEDLES 32G X 4 MM MISC: 1.0000 | Freq: Four times a day (QID) | 5 refills | Status: AC

## 2024-04-08 MED ORDER — OZEMPIC (0.25 OR 0.5 MG/DOSE) 2 MG/3ML ~~LOC~~ SOPN: 0.5000 mg | PEN_INJECTOR | SUBCUTANEOUS | 0 refills | Status: AC

## 2024-04-08 MED ORDER — METFORMIN HCL ER 500 MG PO TB24
500.0000 mg | ORAL_TABLET | Freq: Two times a day (BID) | ORAL | 3 refills | Status: DC
Start: 2024-04-08 — End: 2024-08-05

## 2024-04-08 MED ORDER — INSULIN ASPART FLEXPEN 100 UNIT/ML ~~LOC~~ SOPN: PEN_INJECTOR | SUBCUTANEOUS | 2 refills | Status: DC

## 2024-04-08 MED ORDER — TRESIBA FLEXTOUCH 100 UNIT/ML ~~LOC~~ SOPN: 35.0000 [IU] | PEN_INJECTOR | Freq: Every day | SUBCUTANEOUS | 1 refills | Status: DC

## 2024-04-08 NOTE — Patient Instructions (Signed)
  Will recommend the following: Ozempic 0.25mg  /week -> 0.5 mg after 4 weeks  Start metformin  XR 500 mg once a day with biggest meal->twice a day with meals if well tolerated after 1-2 weeks  Tresiba  35 units qpm Novolog : 22 units with beak fast, 20 units with lunch, 18 units with supper 15 min before meals

## 2024-04-08 NOTE — Progress Notes (Signed)
 The patient reports they are currently: [place]. I spent 12-13 minutes on the video with the patient on the date of service. I spent an additional 5 minutes on pre- and post-visit activities on the date of service.   The patient was physically located in [place]  or a state in which I am permitted to provide care. The patient and/or parent/guardian understood that s/he may incur co-pays and cost sharing, and agreed to the telemedicine visit. The visit was reasonable and appropriate under the circumstances given the patient's presentation at the time.  The patient and/or parent/guardian has been advised of the potential risks and limitations of this mode of treatment (including, but not limited to, the absence of in-person examination) and has agreed to be treated using telemedicine. The patient's/patient's family's questions regarding telemedicine have been answered.   The patient and/or parent/guardian has also been advised to contact their provider's office for worsening conditions, and seek emergency medical treatment and/or call 911 if the patient deems either necessary.    Outpatient Endocrinology Note Jorge Newcomer, MD  04/08/24   Tanya Johnson 12-14-2005 [id number]  Referring Provider: Camilla Cedar, MD Primary Care Provider: Camilla Cedar, MD Reason for consultation: Subjective   Assessment & Plan  Diagnoses and all orders for this visit:  LADA (latent autoimmune diabetes in adults), managed as type 2 (HCC)  Other orders -     Semaglutide,0.25 or 0.5MG /DOS, (OZEMPIC, 0.25 OR 0.5 MG/DOSE,) 2 MG/3ML SOPN; Inject 0.5 mg into the skin once a week. -     metFORMIN  (GLUCOPHAGE -XR) 500 MG 24 hr tablet; Take 1 tablet (500 mg total) by mouth 2 (two) times daily with a meal.    C-peptide + Diabetes Type II complicated by hyperglycemia, No results found for: "GFR" Hba1c goal less than 7, current Hba1c is  Lab Results  Component Value Date   HGBA1C 11.7 (A)  03/24/2024   Will recommend the following: Ozempic 0.25mg  /week -> 0.5 mg weekly after 4 weeks  Start metformin  XR 500 mg once a day with biggest meal->twice a day with meals if well tolerated after 1-2 weeks  Tresiba  35 units qpm Novolog : 22 units with beak fast, 20 units with lunch, 18 units with supper 15 min before meals Ordered DM education previously   Tried metformin  in past, stopped due to dear of side effects, has no contraindications and willing to restart  Has ozempic from pcp that she has used in 2024, threw up previously but wants to retry  No known contraindications/side effects to any of above medications Glucagon  discussed and prescribed with refills on 03/17/24   No history of MEN syndrome/medullary thyroid cancer/pancreatitis or pancreatic cancer in self or family  -Last LD and Tg are as follows: Lab Results  Component Value Date   LDLCALC 156 (H) 03/24/2024    Lab Results  Component Value Date   TRIG 67 03/24/2024   -not on statin -Follow low fat diet and exercise   -Blood pressure goal <140/90 - Microalbumin/creatinine goal is < 30 -Last MA/Cr is as follows: Lab Results  Component Value Date   MICROALBUR 0.2 03/24/2024   -on ACE/ARB lisinopril 20 mg every day  -diet changes including salt restriction -limit eating outside -counseled BP targets per standards of diabetes care -uncontrolled blood pressure can lead to retinopathy, nephropathy and cardiovascular and atherosclerotic heart disease  Reviewed and counseled on: -A1C target -Blood sugar targets -Complications of uncontrolled diabetes  -Checking blood sugar before meals and bedtime and bring log next  visit -All medications with mechanism of action and side effects -Hypoglycemia management: rule of 15's, Glucagon  Emergency Kit and medical alert ID -low-carb low-fat plate-method diet -At least 20 minutes of physical activity per day -Annual dilated retinal eye exam and foot exam -compliance and  follow up needs -follow up as scheduled or earlier if problem gets worse  Call if blood sugar is less than 70 or consistently above 250    Take a 15 gm snack of carbohydrate at bedtime before you go to sleep if your blood sugar is less than 100.    If you are going to fast after midnight for a test or procedure, ask your physician for instructions on how to reduce/decrease your insulin  dose.    Call if blood sugar is less than 70 or consistently above 250  -Treating a low sugar by rule of 15  (15 gms of sugar every 15 min until sugar is more than 70) If you feel your sugar is low, test your sugar to be sure If your sugar is low (less than 70), then take 15 grams of a fast acting Carbohydrate (3-4 glucose tablets or glucose gel or 4 ounces of juice or regular soda) Recheck your sugar 15 min after treating low to make sure it is more than 70 If sugar is still less than 70, treat again with 15 grams of carbohydrate          Don't drive the hour of hypoglycemia  If unconscious/unable to eat or drink by mouth, use glucagon  injection or nasal spray baqsimi  and call 911. Can repeat again in 15 min if still unconscious.  No follow-ups on file.   I have reviewed current medications, nurse's notes, allergies, vital signs, past medical and surgical history, family medical history, and social history for this encounter. Counseled patient on symptoms, examination findings, lab findings, imaging results, treatment decisions and monitoring and prognosis. The patient understood the recommendations and agrees with the treatment plan. All questions regarding treatment plan were fully answered.  Jorge Newcomer, MD  04/08/24    History of Present Illness Tanya Johnson is a 18 y.o. year old female who presents for follow up of Type II diabetes mellitus.  Tanya Johnson was first diagnosed in 2020.   Diabetes education +  Home diabetes regimen: Tresiba  35 units qpm Novolog : 20 units 15 min  before meals  COMPLICATIONS -  MI/Stroke -  retinopathy -  neuropathy -  nephropathy  SYMPTOMS REVIEWED - Polyuria - Weight loss - Blurred vision  BLOOD SUGAR DATA  CGM interpretation: At today's visit, we reviewed her CGM downloads. The full report is scanned in the media. Reviewing the CGM trends, BG are  elevated across the day (some lows overnight and after dinner).  Physical Exam  There were no vitals taken for this visit.   Constitutional: well developed, well nourished Head: normocephalic, atraumatic Eyes: sclera anicteric, no redness Neck: supple Lungs: normal respiratory effort Neurology: alert and oriented Skin: dry, no appreciable rashes Musculoskeletal: no appreciable defects Psychiatric: normal mood and affect Diabetic Foot Exam - Simple   No data filed      Current Medications Patient's Medications  New Prescriptions   SEMAGLUTIDE,0.25 OR 0.5MG /DOS, (OZEMPIC, 0.25 OR 0.5 MG/DOSE,) 2 MG/3ML SOPN    Inject 0.5 mg into the skin once a week.  Previous Medications   ACCU-CHEK FASTCLIX LANCETS MISC    USE TO CHECK BLOOD SUGAR SIX TIMES DAILY   ACETONE, URINE, TEST STRIP  Check ketones per protocol   BD PEN NEEDLE MICRO U/F 32G X 6 MM MISC    USE TO INJECT INSULIN  6 TIMES A DAILY   BD PEN NEEDLE NANO 2ND GEN 32G X 4 MM MISC    USE TO INJECT INSULIN  6 TIMES DAILY   CONTINUOUS BLOOD GLUC RECEIVER (DEXCOM G6 RECEIVER) DEVI    Use with Dexcom Sensor and Transmitter to check Blood Sugars   CONTINUOUS BLOOD GLUC TRANSMIT (DEXCOM G6 TRANSMITTER) MISC    Use with Dexcom Sensor, reuse transmitter for 3 months.   CONTINUOUS GLUCOSE SENSOR (DEXCOM G7 SENSOR) MISC    Use 1 sensor as directed every 10 days to monitor glucose continuously.   CONTINUOUS GLUCOSE SENSOR (DEXCOM G7 SENSOR) MISC    1 Device by Does not apply route continuous.   CONTINUOUS GLUCOSE TRANSMITTER (DEXCOM G6 TRANSMITTER) MISC    Use 1 transmitter as directed every 90 days   FLUTICASONE  (FLONASE ) 50  MCG/ACT NASAL SPRAY    Place 1 spray into both nostrils daily.   GLUCAGON  (BAQSIMI  ONE PACK) 3 MG/DOSE POWD    Place 1 Device into the nose as needed (Low blood sugar with impaired consciousness).   GLUCAGON  (BAQSIMI  TWO PACK) 3 MG/DOSE POWD    Place 1 each into the nose as needed (severe hypoglycmia with unresponsiveness).   GLUCOSE BLOOD (ACCU-CHEK GUIDE) TEST STRIP    USE TO CHECK BLOOD SUGAR SIX TIMES DAILY   GLUCOSE BLOOD (IGLUCOSE TEST STRIPS VI)       INSULIN  ASPART (NOVOLOG ) 100 UNIT/ML INJECTION    Inject up to 200 units into insulin  pump as directed every 2-3 days. Please fill for VIAL.   INSULIN  ASPART FLEXPEN (NOVOLOG ) 100 UNIT/ML    ADMINISTER UP TO 100 UNITS UNDER THE SKIN EVERY DAY AS DIRECTED BY PRESCRIBER   LANCETS MISC. (ACCU-CHEK FASTCLIX LANCET) KIT    Check sugar 6 times daily   LISINOPRIL (ZESTRIL) 20 MG TABLET    Take by mouth.   LORATADINE  (CLARITIN ) 10 MG TABLET    Take 1 tablet (10 mg total) by mouth daily.   MONTELUKAST  (SINGULAIR ) 4 MG CHEWABLE TABLET    Chew 1 tablet (4 mg total) by mouth at bedtime.   NAPROXEN  (NAPROSYN ) 500 MG TABLET    Take 1 tablet (500 mg total) by mouth 2 (two) times daily with a meal.   OSTOMY SUPPLIES (SKIN TAC ADHESIVE BARRIER WIPE) MISC    For use with Dexcom   TRESIBA  FLEXTOUCH 100 UNIT/ML FLEXTOUCH PEN    ADMINISTER UP TO 50 UNITS UNDER THE SKIN DAILY AS DIRECTED  Modified Medications   Modified Medication Previous Medication   METFORMIN  (GLUCOPHAGE -XR) 500 MG 24 HR TABLET metFORMIN  (GLUCOPHAGE -XR) 500 MG 24 hr tablet      Take 1 tablet (500 mg total) by mouth 2 (two) times daily with a meal.    TAKE 1 TABLET(500 MG) BY MOUTH IN THE MORNING AND AT BEDTIME  Discontinued Medications   No medications on file    Allergies No Known Allergies  Past Medical History Past Medical History:  Diagnosis Date   Abnormal food appetite 02/21/2017   Allergic rhinitis 09/05/2013   Elevated transaminase level 03/04/2020   Hypertension    Obesity,  unspecified 09/05/2013   Prediabetes 03/27/2014   Hgb A1C 5.9%     Unspecified asthma(493.90) 09/05/2013    Past Surgical History Past Surgical History:  Procedure Laterality Date   TONSILLECTOMY     TYMPANOSTOMY TUBE PLACEMENT  Family History family history includes Asthma in her sister; Bronchitis in her sister; Diabetes in her mother, paternal grandfather, and paternal grandmother; Heart disease in her paternal grandfather; Hypertension in her mother, paternal grandfather, and paternal grandmother; Obesity in her paternal aunt, paternal grandfather, and paternal grandmother.  Social History Social History   Socioeconomic History   Marital status: Single    Spouse name: Not on file   Number of children: Not on file   Years of education: Not on file   Highest education level: Not on file  Occupational History   Not on file  Tobacco Use   Smoking status: Never    Passive exposure: Yes   Smokeless tobacco: Never   Tobacco comments:    mom smokes  Vaping Use   Vaping status: Never Used  Substance and Sexual Activity   Alcohol use: Never   Drug use: Not Currently    Types: Marijuana    Comment: has tried once in the past   Sexual activity: Not Currently    Birth control/protection: None    Comment: lost her virginity when she was 10 per pt  Other Topics Concern   Not on file  Social History Narrative   Lives with dad, PGF and PGM and Paternal Aunt, brother age 15 year old   Goes to [place] on weekends. Sisters 3 and 2 live with mom      2018 lives with mom and stepdad,and siblings  mom smokes   Visits dad on weekends.      She is in 9th grade       she gets A's and B's. She enjoys playing outside, listening to music, and drawing.       2021: stays with grandmother most of the time, but also sometimes stays with mom      2022: 10th grade at [employer].       [employer] 12th grade 23-24 school year   Social Drivers of Health    Financial Resource Strain: Not on file  Food Insecurity: Not on file  Transportation Needs: Not on file  Physical Activity: Not on file  Stress: Not on file  Social Connections: Not on file  Intimate Partner Violence: Not on file    Lab Results  Component Value Date   HGBA1C 11.7 (A) 03/24/2024   HGBA1C 9.4 05/07/2023   HGBA1C 8.7 (A) 08/15/2021   Lab Results  Component Value Date   CHOL 219 (H) 03/24/2024   Lab Results  Component Value Date   HDL 47 03/24/2024   Lab Results  Component Value Date   LDLCALC 156 (H) 03/24/2024   Lab Results  Component Value Date   TRIG 67 03/24/2024   Lab Results  Component Value Date   CHOLHDL 4.7 03/24/2024   Lab Results  Component Value Date   CREATININE 0.76 03/24/2024   No results found for: "GFR" Lab Results  Component Value Date   MICROALBUR 0.2 03/24/2024      Component Value Date/Time   NA 136 03/24/2024 0934   K 4.7 03/24/2024 0934   CL 100 03/24/2024 0934   CO2 28 03/24/2024 0934   GLUCOSE 286 (H) 03/24/2024 0934   BUN 17 03/24/2024 0934   CREATININE 0.76 03/24/2024 0934   CALCIUM  9.1 03/24/2024 0934   PROT 6.7 03/24/2024 0934   ALBUMIN 4.1 03/24/2014 0912   AST 13 03/24/2024 0934   ALT 18 03/24/2024 0934   ALKPHOS 205 03/24/2014 0912  BILITOT 0.2 03/24/2024 0934   GFRNONAA NOT CALCULATED 03/07/2020 0451   GFRAA NOT CALCULATED 03/07/2020 0451      Latest Ref Rng & Units 03/24/2024    9:34 AM 04/02/2023   11:27 AM 03/07/2020    4:51 AM  BMP  Glucose 65 - 99 mg/dL 045  409  811   BUN 7 - 20 mg/dL 17  12  6    Creatinine 0.50 - 0.96 mg/dL 9.14  7.82  9.56   BUN/Creat Ratio 6 - 22 (calc) SEE NOTE:  SEE NOTE:    Sodium 135 - 146 mmol/L 136  137  138   Potassium 3.8 - 5.1 mmol/L 4.7  5.3  3.4   Chloride 98 - 110 mmol/L 100  102  108   CO2 20 - 32 mmol/L 28  27  20    Calcium  8.9 - 10.4 mg/dL 9.1  9.2  8.5        Component Value Date/Time   WBC 12.8 03/02/2020 2214   RBC 5.16 03/02/2020 2214   HGB  14.7 (A) 03/25/2020 1529   HGB 14.3 03/04/2020 2244   HGB 13.5 06/17/2019 1602   HCT 42.0 03/04/2020 2244   HCT 39.8 06/17/2019 1602   PLT 397 03/02/2020 2214   PLT 416 06/17/2019 1602   MCV 85.5 03/02/2020 2214   MCV 85 06/17/2019 1602   MCH 28.1 03/02/2020 2214   MCHC 32.9 03/02/2020 2214   RDW 12.4 03/02/2020 2214   RDW 13.1 06/17/2019 1602   LYMPHSABS 3.7 03/02/2020 2214   LYMPHSABS 2.9 06/17/2019 1602   MONOABS 0.8 03/02/2020 2214   EOSABS 0.1 03/02/2020 2214   EOSABS 0.2 06/17/2019 1602   BASOSABS 0.1 03/02/2020 2214   BASOSABS 0.0 06/17/2019 1602     Parts of this note may have been dictated using voice recognition software. There may be variances in spelling and vocabulary which are unintentional. Not all errors are proofread. Please notify the [facility] if any discrepancies are noted or if the meaning of any statement is not clear.

## 2024-06-05 ENCOUNTER — Telehealth: Payer: Self-pay

## 2024-06-05 ENCOUNTER — Other Ambulatory Visit (HOSPITAL_COMMUNITY): Payer: Self-pay

## 2024-06-05 NOTE — Telephone Encounter (Signed)
 Pharmacy Patient Advocate Encounter   Received notification from Pt Calls Messages that prior authorization for Dexcom G7 sensor is required/requested.   Insurance verification completed.   The patient is insured through [facility] .   Per test claim: PA required and submitted KEY/EOC/Request #: [id number] from 06/05/2024 to 12/02/2024  PA Case ID #: [id number]

## 2024-06-05 NOTE — Telephone Encounter (Signed)
 Pt needs PA done for Dexcom G7 . Thanks.

## 2024-06-06 ENCOUNTER — Encounter: Payer: Self-pay | Admitting: "Endocrinology

## 2024-06-06 ENCOUNTER — Telehealth (INDEPENDENT_AMBULATORY_CARE_PROVIDER_SITE_OTHER): Admitting: "Endocrinology

## 2024-06-06 DIAGNOSIS — E139 Other specified diabetes mellitus without complications: Secondary | ICD-10-CM | POA: Diagnosis not present

## 2024-06-06 DIAGNOSIS — Z7985 Long-term (current) use of injectable non-insulin antidiabetic drugs: Secondary | ICD-10-CM

## 2024-06-06 DIAGNOSIS — Z7984 Long term (current) use of oral hypoglycemic drugs: Secondary | ICD-10-CM | POA: Diagnosis not present

## 2024-06-06 DIAGNOSIS — Z794 Long term (current) use of insulin: Secondary | ICD-10-CM

## 2024-06-06 DIAGNOSIS — E7800 Pure hypercholesterolemia, unspecified: Secondary | ICD-10-CM

## 2024-06-06 NOTE — Patient Instructions (Addendum)
  Will recommend the following: Ozempic  0.25mg  /week -> 0.5 mg weekly after 4 weeks  Metformin  XR 500 mg twice a day with meals if well tolerated after 1 week, increase to 1 pill in morning and two at night->after 2 weeks, switch to 2 pills in morning and two at night Tresiba  36 units qpm Novolog : 22 units with beak fast, 20 units with lunch, 18 units with supper 15 min before meals  _________   Goals of DM therapy:  Morning Fasting blood sugar: 80-140  Blood sugar before meals: 80-140 Bed time blood sugar: 100-150  A1C <7%, limited only by hypoglycemia  1.Diabetes medications and their side effects discussed, including hypoglycemia    2. Check blood glucose:  a) Always check blood sugars before driving. Please see below (under hypoglycemia) on how to manage b) Check a minimum of 3 times/day or more as needed when having symptoms of hypoglycemia.   c) Try to check blood glucose before sleeping/in the middle of the night to ensure that it is remaining stable and not dropping less than 100 d) Check blood glucose more often if sick  3. Diet: a) 3 meals per day schedule b: Restrict carbs to 60-70 grams (4 servings) per meal c) Colorful vegetables - 3 servings a day, and low sugar fruit 2 servings/day Plate control method: 1/4 plate protein, 1/4 starch, 1/2 green, yellow, or red vegetables d) Avoid carbohydrate snacks unless hypoglycemic episode, or increased physical activity  4. Regular exercise as tolerated, preferably 3 or more hours a week  5. Hypoglycemia: a)  Do not drive or operate machinery without first testing blood glucose to assure it is over 90 mg%, or if dizzy, lightheaded, not feeling normal, etc, or  if foot or leg is numb or weak. b)  If blood glucose less than 70, take four 5gm Glucose tabs or 15-30 gm Glucose gel.  Repeat every 15 min as needed until blood sugar is >100 mg/dl. If hypoglycemia persists then call 911.   6. Sick day management: a) Check blood  glucose more often b) Continue usual therapy if blood sugars are elevated.   7. Contact the doctor immediately if blood glucose is frequently <60 mg/dl, or an episode of severe hypoglycemia occurs (where someone had to give you glucose/  glucagon  or if you passed out from a low blood glucose), or if blood glucose is persistently >350 mg/dl, for further management  8. A change in level of physical activity or exercise and a change in diet may also affect your blood sugar. Check blood sugars more often and call if needed.  Instructions: 1. Bring glucose meter, blood glucose records on every visit for review 2. Continue to follow up with primary care physician and other providers for medical care 3. Yearly eye  and foot exam 4. Please get blood work done prior to the next appointment

## 2024-06-06 NOTE — Progress Notes (Signed)
 The patient reports they are currently: . I spent 8 minutes on the video with the patient on the date of service. I spent an additional 5 minutes on pre- and post-visit activities on the date of service.   The patient was physically located in [place]  or a state in which I am permitted to provide care. The patient and/or parent/guardian understood that s/he may incur co-pays and cost sharing, and agreed to the telemedicine visit. The visit was reasonable and appropriate under the circumstances given the patient's presentation at the time.  The patient and/or parent/guardian has been advised of the potential risks and limitations of this mode of treatment (including, but not limited to, the absence of in-person examination) and has agreed to be treated using telemedicine. The patient's/patient's family's questions regarding telemedicine have been answered.   The patient and/or parent/guardian has also been advised to contact their provider's office for worsening conditions, and seek emergency medical treatment and/or call 911 if the patient deems either necessary.    Outpatient Endocrinology Note Tanya Birmingham, MD  06/06/24   [place] DELENA Browner 10/23/2006 [id number]  Referring Provider: Caswell Alstrom, MD Primary Care Provider: Caswell Alstrom, MD Reason for consultation: Subjective   Assessment & Plan  Diagnoses and all orders for this visit:  LADA (latent autoimmune diabetes in adults), managed as type 2 (HCC)  Long-term insulin  use (HCC)  Long term (current) use of oral hypoglycemic drugs  Long-term (current) use of injectable non-insulin  antidiabetic drugs  Pure hypercholesterolemia   C-peptide +, GAD Ab+ Diabetes Type II complicated by hyperglycemia, No results found for: GFR Hba1c goal less than 7, current Hba1c is  Lab Results  Component Value Date   HGBA1C 11.7 (A) 03/24/2024     Will recommend the following: Ozempic  0.25mg  /week -> 0.5 mg weekly  after 4 weeks  Metformin  XR 500 mg twice a day with meals if well tolerated after 1 week, increase to 1 pill in morning and two at night->after 2 weeks, switch to 2 pills in morning and two at night Tresiba  36 units qpm Novolog : 22 units with beak fast, 20 units with lunch, 18 units with supper 15 min before meals  Ordered DM education previously  Tried metformin  in past, stopped due to dear of side effects, has no contraindications and willing to restart  Has ozempic  from pcp that she has used in 2024, threw up previously but wants to retry  No known contraindications/side effects to any of above medications Glucagon  discussed and prescribed with refills on 03/17/24   No history of MEN syndrome/medullary thyroid cancer/pancreatitis or pancreatic cancer in self or family  -Last LD and Tg are as follows: Lab Results  Component Value Date   LDLCALC 156 (H) 03/24/2024    Lab Results  Component Value Date   TRIG 67 03/24/2024   -not on statin -Follow low fat diet and exercise   -Blood pressure goal <140/90 - Microalbumin/creatinine goal is < 30 -Last MA/Cr is as follows: Lab Results  Component Value Date   MICROALBUR 0.2 03/24/2024   -on ACE/ARB lisinopril 20 mg every day  -diet changes including salt restriction -limit eating outside -counseled BP targets per standards of diabetes care -uncontrolled blood pressure can lead to retinopathy, nephropathy and cardiovascular and atherosclerotic heart disease  Reviewed and counseled on: -A1C target -Blood sugar targets -Complications of uncontrolled diabetes  -Checking blood sugar before meals and bedtime and bring log next visit -All medications with mechanism of action and side effects -Hypoglycemia  management: rule of 15's, Glucagon  Emergency Kit and medical alert ID -low-carb low-fat plate-method diet -At least 20 minutes of physical activity per day -Annual dilated retinal eye exam and foot exam -compliance and follow up  needs -follow up as scheduled or earlier if problem gets worse  Call if blood sugar is less than 70 or consistently above 250    Take a 15 gm snack of carbohydrate at bedtime before you go to sleep if your blood sugar is less than 100.    If you are going to fast after midnight for a test or procedure, ask your physician for instructions on how to reduce/decrease your insulin  dose.    Call if blood sugar is less than 70 or consistently above 250  -Treating a low sugar by rule of 15  (15 gms of sugar every 15 min until sugar is more than 70) If you feel your sugar is low, test your sugar to be sure If your sugar is low (less than 70), then take 15 grams of a fast acting Carbohydrate (3-4 glucose tablets or glucose gel or 4 ounces of juice or regular soda) Recheck your sugar 15 min after treating low to make sure it is more than 70 If sugar is still less than 70, treat again with 15 grams of carbohydrate          Don't drive the hour of hypoglycemia  If unconscious/unable to eat or drink by mouth, use glucagon  injection or nasal spray baqsimi  and call 911. Can repeat again in 15 min if still unconscious.  Return in about 4 weeks (around 07/04/2024).   I have reviewed current medications, nurse's notes, allergies, vital signs, past medical and surgical history, family medical history, and social history for this encounter. Counseled patient on symptoms, examination findings, lab findings, imaging results, treatment decisions and monitoring and prognosis. The patient understood the recommendations and agrees with the treatment plan. All questions regarding treatment plan were fully answered.  Tanya Birmingham, MD  06/06/24    History of Present Illness Tanya Johnson is a 18 y.o. year old female who presents for follow up of Type II diabetes mellitus.  Tanya Johnson was first diagnosed in 2020.   Diabetes education +  Home diabetes regimen: Ozempic  0.25mg /week  (non-compliant) metformin  XR 500 mg once a day  Tresiba  36 units qpm Novolog : 22 units with beak fast, 20 units with lunch, 18 units with supper 15 min before meals  COMPLICATIONS -  MI/Stroke -  retinopathy -  neuropathy -  nephropathy  SYMPTOMS REVIEWED - Polyuria - Weight loss - Blurred vision  BLOOD SUGAR DATA 198-310 per recall  Physical Exam  There were no vitals taken for this visit.   Constitutional: well developed, well nourished Head: normocephalic, atraumatic Eyes: sclera anicteric, no redness Neck: supple Lungs: normal respiratory effort Neurology: alert and oriented Skin: dry, no appreciable rashes Musculoskeletal: no appreciable defects Psychiatric: normal mood and affect Diabetic Foot Exam - Simple   No data filed      Current Medications Patient's Medications  New Prescriptions   No medications on file  Previous Medications   ACCU-CHEK FASTCLIX LANCETS MISC    USE TO CHECK BLOOD SUGAR SIX TIMES DAILY   ACETONE, URINE, TEST STRIP    Check ketones per protocol   BD PEN NEEDLE MICRO U/F 32G X 6 MM MISC    USE TO INJECT INSULIN  6 TIMES A DAILY   BD PEN NEEDLE NANO 2ND GEN 32G X  4 MM MISC    USE TO INJECT INSULIN  6 TIMES DAILY   CONTINUOUS BLOOD GLUC RECEIVER (DEXCOM G6 RECEIVER) DEVI    Use with Dexcom Sensor and Transmitter to check Blood Sugars   CONTINUOUS BLOOD GLUC TRANSMIT (DEXCOM G6 TRANSMITTER) MISC    Use with Dexcom Sensor, reuse transmitter for 3 months.   CONTINUOUS GLUCOSE SENSOR (DEXCOM G7 SENSOR) MISC    Use 1 sensor as directed every 10 days to monitor glucose continuously.   CONTINUOUS GLUCOSE SENSOR (DEXCOM G7 SENSOR) MISC    1 Device by Does not apply route continuous.   CONTINUOUS GLUCOSE TRANSMITTER (DEXCOM G6 TRANSMITTER) MISC    Use 1 transmitter as directed every 90 days   FLUTICASONE  (FLONASE ) 50 MCG/ACT NASAL SPRAY    Place 1 spray into both nostrils daily.   GLUCAGON  (BAQSIMI  ONE PACK) 3 MG/DOSE POWD    Place 1 Device into the  nose as needed (Low blood sugar with impaired consciousness).   GLUCAGON  (BAQSIMI  TWO PACK) 3 MG/DOSE POWD    Place 1 each into the nose as needed (severe hypoglycmia with unresponsiveness).   GLUCOSE BLOOD (ACCU-CHEK GUIDE) TEST STRIP    USE TO CHECK BLOOD SUGAR SIX TIMES DAILY   GLUCOSE BLOOD (IGLUCOSE TEST STRIPS VI)       INSULIN  ASPART (NOVOLOG ) 100 UNIT/ML INJECTION    Inject up to 200 units into insulin  pump as directed every 2-3 days. Please fill for VIAL.   INSULIN  ASPART FLEXPEN (NOVOLOG ) 100 UNIT/ML    Inject 22 units before break fast, 20 units before lunch and 18 units before supper 15 min before meals   INSULIN  DEGLUDEC (TRESIBA  FLEXTOUCH) 100 UNIT/ML FLEXTOUCH PEN    Inject 35 Units into the skin daily.   INSULIN  PEN NEEDLE (PEN NEEDLES) 32G X 4 MM MISC    1 Device by Does not apply route in the morning, at noon, in the evening, and at bedtime.   LANCETS MISC. (ACCU-CHEK FASTCLIX LANCET) KIT    Check sugar 6 times daily   LISINOPRIL (ZESTRIL) 20 MG TABLET    Take by mouth.   LORATADINE  (CLARITIN ) 10 MG TABLET    Take 1 tablet (10 mg total) by mouth daily.   METFORMIN  (GLUCOPHAGE -XR) 500 MG 24 HR TABLET    Take 1 tablet (500 mg total) by mouth 2 (two) times daily with a meal.   MONTELUKAST  (SINGULAIR ) 4 MG CHEWABLE TABLET    Chew 1 tablet (4 mg total) by mouth at bedtime.   NAPROXEN  (NAPROSYN ) 500 MG TABLET    Take 1 tablet (500 mg total) by mouth 2 (two) times daily with a meal.   OSTOMY SUPPLIES (SKIN TAC ADHESIVE BARRIER WIPE) MISC    For use with Dexcom   SEMAGLUTIDE ,0.25 OR 0.5MG /DOS, (OZEMPIC , 0.25 OR 0.5 MG/DOSE,) 2 MG/3ML SOPN    Inject 0.5 mg into the skin once a week.  Modified Medications   No medications on file  Discontinued Medications   No medications on file    Allergies No Known Allergies  Past Medical History Past Medical History:  Diagnosis Date   Abnormal food appetite 02/21/2017   Allergic rhinitis 09/05/2013   Elevated transaminase level 03/04/2020    Hypertension    Obesity, unspecified 09/05/2013   Prediabetes 03/27/2014   Hgb A1C 5.9%     Unspecified asthma(493.90) 09/05/2013    Past Surgical History Past Surgical History:  Procedure Laterality Date   TONSILLECTOMY     TYMPANOSTOMY TUBE PLACEMENT  Family History family history includes Asthma in her sister; Bronchitis in her sister; Diabetes in her mother, paternal grandfather, and paternal grandmother; Heart disease in her paternal grandfather; Hypertension in her mother, paternal grandfather, and paternal grandmother; Obesity in her paternal aunt, paternal grandfather, and paternal grandmother.  Social History Social History   Socioeconomic History   Marital status: Single    Spouse name: Not on file   Number of children: Not on file   Years of education: Not on file   Highest education level: Not on file  Occupational History   Not on file  Tobacco Use   Smoking status: Never    Passive exposure: Yes   Smokeless tobacco: Never   Tobacco comments:    mom smokes  Vaping Use   Vaping status: Never Used  Substance and Sexual Activity   Alcohol use: Never   Drug use: Not Currently    Types: Marijuana    Comment: has tried once in the past   Sexual activity: Not Currently    Birth control/protection: None    Comment: lost her virginity when she was 10 per pt  Other Topics Concern   Not on file  Social History Narrative   Lives with dad, PGF and PGM and Paternal Aunt, brother age 3 year old   Goes to [employer] on weekends. Sisters 3 and 2 live with mom      2018 lives with mom and stepdad,and siblings  mom smokes   Visits dad on weekends.      She is in 9th grade       she gets A's and B's. She enjoys playing outside, listening to music, and drawing.       2021: stays with grandmother most of the time, but also sometimes stays with mom      2022: 10th grade at [employer].       2024[employer] 12th grade 23-24 school year    Social Drivers of Health   Financial Resource Strain: Not on file  Food Insecurity: Not on file  Transportation Needs: Not on file  Physical Activity: Not on file  Stress: Not on file  Social Connections: Not on file  Intimate Partner Violence: Not on file    Lab Results  Component Value Date   HGBA1C 11.7 (A) 03/24/2024   HGBA1C 9.4 05/07/2023   HGBA1C 8.7 (A) 08/15/2021   Lab Results  Component Value Date   CHOL 219 (H) 03/24/2024   Lab Results  Component Value Date   HDL 47 03/24/2024   Lab Results  Component Value Date   LDLCALC 156 (H) 03/24/2024   Lab Results  Component Value Date   TRIG 67 03/24/2024   Lab Results  Component Value Date   CHOLHDL 4.7 03/24/2024   Lab Results  Component Value Date   CREATININE 0.76 03/24/2024   No results found for: GFR Lab Results  Component Value Date   MICROALBUR 0.2 03/24/2024      Component Value Date/Time   NA 136 03/24/2024 0934   K 4.7 03/24/2024 0934   CL 100 03/24/2024 0934   CO2 28 03/24/2024 0934   GLUCOSE 286 (H) 03/24/2024 0934   BUN 17 03/24/2024 0934   CREATININE 0.76 03/24/2024 0934   CALCIUM  9.1 03/24/2024 0934   PROT 6.7 03/24/2024 0934   ALBUMIN 4.1 03/24/2014 0912   AST 13 03/24/2024 0934   ALT 18 03/24/2024 0934   ALKPHOS 205 03/24/2014 0912  BILITOT 0.2 03/24/2024 0934   GFRNONAA NOT CALCULATED 03/07/2020 0451   GFRAA NOT CALCULATED 03/07/2020 0451      Latest Ref Rng & Units 03/24/2024    9:34 AM 04/02/2023   11:27 AM 03/07/2020    4:51 AM  BMP  Glucose 65 - 99 mg/dL 713  707  803   BUN 7 - 20 mg/dL 17  12  6    Creatinine 0.50 - 0.96 mg/dL 9.23  9.21  9.44   BUN/Creat Ratio 6 - 22 (calc) SEE NOTE:  SEE NOTE:    Sodium 135 - 146 mmol/L 136  137  138   Potassium 3.8 - 5.1 mmol/L 4.7  5.3  3.4   Chloride 98 - 110 mmol/L 100  102  108   CO2 20 - 32 mmol/L 28  27  20    Calcium  8.9 - 10.4 mg/dL 9.1  9.2  8.5        Component Value Date/Time   WBC 12.8 03/02/2020 2214   RBC  5.16 03/02/2020 2214   HGB 14.7 (A) 03/25/2020 1529   HGB 14.3 03/04/2020 2244   HGB 13.5 06/17/2019 1602   HCT 42.0 03/04/2020 2244   HCT 39.8 06/17/2019 1602   PLT 397 03/02/2020 2214   PLT 416 06/17/2019 1602   MCV 85.5 03/02/2020 2214   MCV 85 06/17/2019 1602   MCH 28.1 03/02/2020 2214   MCHC 32.9 03/02/2020 2214   RDW 12.4 03/02/2020 2214   RDW 13.1 06/17/2019 1602   LYMPHSABS 3.7 03/02/2020 2214   LYMPHSABS 2.9 06/17/2019 1602   MONOABS 0.8 03/02/2020 2214   EOSABS 0.1 03/02/2020 2214   EOSABS 0.2 06/17/2019 1602   BASOSABS 0.1 03/02/2020 2214   BASOSABS 0.0 06/17/2019 1602     Parts of this note may have been dictated using voice recognition software. There may be variances in spelling and vocabulary which are unintentional. Not all errors are proofread. Please notify the dino if any discrepancies are noted or if the meaning of any statement is not clear.

## 2024-06-09 ENCOUNTER — Telehealth: Payer: Self-pay | Admitting: Orthopaedic Surgery

## 2024-06-09 ENCOUNTER — Encounter: Payer: Self-pay | Admitting: "Endocrinology

## 2024-06-09 NOTE — Telephone Encounter (Signed)
 Returned the pt's call from 06/07/23 at 2:24pm, she had questions about her appt, there is nothing scheduled for her.  Last AVS says:  Return in about 3 weeks (around 08/01/2023) for MRI right knee, did she have the MRI done?  Unable to lvm, states call can not be completed at this time.

## 2024-07-18 ENCOUNTER — Encounter: Payer: Self-pay | Admitting: Radiology

## 2024-08-01 ENCOUNTER — Other Ambulatory Visit: Payer: Self-pay | Admitting: "Endocrinology

## 2024-08-01 ENCOUNTER — Ambulatory Visit: Admitting: "Endocrinology

## 2024-08-04 ENCOUNTER — Other Ambulatory Visit: Payer: Self-pay | Admitting: "Endocrinology

## 2024-08-06 ENCOUNTER — Encounter: Payer: Self-pay | Admitting: "Endocrinology

## 2024-08-06 ENCOUNTER — Ambulatory Visit (INDEPENDENT_AMBULATORY_CARE_PROVIDER_SITE_OTHER): Admitting: "Endocrinology

## 2024-08-06 VITALS — BP 106/80 | HR 84 | Ht 63.0 in | Wt 361.0 lb

## 2024-08-06 DIAGNOSIS — E7800 Pure hypercholesterolemia, unspecified: Secondary | ICD-10-CM | POA: Diagnosis not present

## 2024-08-06 DIAGNOSIS — E139 Other specified diabetes mellitus without complications: Secondary | ICD-10-CM

## 2024-08-06 DIAGNOSIS — Z794 Long term (current) use of insulin: Secondary | ICD-10-CM

## 2024-08-06 DIAGNOSIS — Z7984 Long term (current) use of oral hypoglycemic drugs: Secondary | ICD-10-CM

## 2024-08-06 LAB — POCT GLYCOSYLATED HEMOGLOBIN (HGB A1C): Hemoglobin A1C: 10.5 % — AB (ref 4.0–5.6)

## 2024-08-06 MED ORDER — BLOOD GLUCOSE TEST VI STRP: 1.0000 | ORAL_STRIP | Freq: Three times a day (TID) | 3 refills | Status: AC

## 2024-08-06 MED ORDER — LANCETS MISC. MISC: 1.0000 | Freq: Three times a day (TID) | 3 refills | Status: AC

## 2024-08-06 MED ORDER — BLOOD GLUCOSE MONITORING SUPPL DEVI: 1.0000 | Freq: Three times a day (TID) | 0 refills | Status: AC

## 2024-08-06 MED ORDER — LANCET DEVICE MISC: 1.0000 | Freq: Three times a day (TID) | 0 refills | Status: AC

## 2024-08-06 NOTE — Progress Notes (Signed)
 The patient reports they are currently: Tanya Johnson. I spent 8 minutes on the video with the patient on the date of service. I spent an additional 5 minutes on pre- and post-visit activities on the date of service.   The patient was physically located in [place]  or a state in which I am permitted to provide care. The patient and/or parent/guardian understood that s/he may incur co-pays and cost sharing, and agreed to the telemedicine visit. The visit was reasonable and appropriate under the circumstances given the patient's presentation at the time.  The patient and/or parent/guardian has been advised of the potential risks and limitations of this mode of treatment (including, but not limited to, the absence of in-person examination) and has agreed to be treated using telemedicine. The patient's/patient's family's questions regarding telemedicine have been answered.   The patient and/or parent/guardian has also been advised to contact their provider's office for worsening conditions, and seek emergency medical treatment and/or call 911 if the patient deems either necessary.    Outpatient Endocrinology Note [name], MD  08/06/24   [place] DELENA Browner [birth year] [id number]  Referring Provider: Caswell Alstrom, MD Primary Care Provider: Caswell Alstrom, MD Reason for consultation: Subjective   Assessment & Plan  Diagnoses and all orders for this visit:  LADA (latent autoimmune diabetes in adults), managed as type 2 (HCC)  Long term (current) use of oral hypoglycemic drugs  Long-term (current) use of injectable non-insulin  antidiabetic drugs  Long-term insulin  use (HCC) -     POCT glycosylated hemoglobin (Hb A1C)  Pure hypercholesterolemia  Other orders -     Blood Glucose Monitoring Suppl DEVI; 1 each by Does not apply route in the morning, at noon, and at bedtime. May substitute to any manufacturer covered by patient's insurance. -     Glucose Blood (BLOOD GLUCOSE TEST  STRIPS) STRP; 1 each by In Vitro route in the morning, at noon, and at bedtime. May substitute to any manufacturer covered by patient's insurance. -     Lancet Device MISC; 1 each by Does not apply route in the morning, at noon, and at bedtime. May substitute to any manufacturer covered by patient's insurance. -     Lancets Misc. MISC; 1 each by Does not apply route in the morning, at noon, and at bedtime. May substitute to any manufacturer covered by patient's insurance.   C-peptide +, GAD Ab+ Diabetes Type II complicated by hyperglycemia, No results found for: GFR Hba1c goal less than 7, current Hba1c is  Lab Results  Component Value Date   HGBA1C 11.7 (A) 03/24/2024     Will recommend the following: Ozempic  0.25mg  /week -> 0.5 mg weekly after 4 weeks  Metformin  XR 500 mg twice a day with meals if well tolerated after 1 week, increase to 1 pill in morning and two at night->after 2 weeks, switch to 2 pills in morning and two at night Tresiba  36 units daily. Increase by 1 unit every day until fasting blood sugar is less than 120. Stay on that dose.   Novolog : 22 units with beak fast, 20 units with lunch, 18 units with supper 15 min before meals 08/06/24: Cannot adjust insulin  without accurate recent blood sugar data, patient understands; counseled extensively   Ordered DM education previously  Tried metformin  in past, stopped due to dear of side effects, has no contraindications and willing to restart  Has ozempic  from pcp that she has used in 2024, threw up previously but wants to retry  No known  contraindications/side effects to any of above medications Glucagon  discussed and prescribed with refills on 03/17/24   No history of MEN syndrome/medullary thyroid cancer/pancreatitis or pancreatic cancer in self or family  -Last LD and Tg are as follows: Lab Results  Component Value Date   LDLCALC 156 (H) 03/24/2024    Lab Results  Component Value Date   TRIG 67 03/24/2024   -not on  statin -Follow low fat diet and exercise   -Blood pressure goal <140/90 - Microalbumin/creatinine goal is < 30 -Last MA/Cr is as follows: Lab Results  Component Value Date   MICROALBUR 0.2 03/24/2024   -on ACE/ARB lisinopril 20 mg every day  -diet changes including salt restriction -limit eating outside -counseled BP targets per standards of diabetes care -uncontrolled blood pressure can lead to retinopathy, nephropathy and cardiovascular and atherosclerotic heart disease  Reviewed and counseled on: -A1C target -Blood sugar targets -Complications of uncontrolled diabetes  -Checking blood sugar before meals and bedtime and bring log next visit -All medications with mechanism of action and side effects -Hypoglycemia management: rule of 15's, Glucagon  Emergency Kit and medical alert ID -low-carb low-fat plate-method diet -At least 20 minutes of physical activity per day -Annual dilated retinal eye exam and foot exam -compliance and follow up needs -follow up as scheduled or earlier if problem gets worse  Call if blood sugar is less than 70 or consistently above 250    Take a 15 gm snack of carbohydrate at bedtime before you go to sleep if your blood sugar is less than 100.    If you are going to fast after midnight for a test or procedure, ask your physician for instructions on how to reduce/decrease your insulin  dose.    Call if blood sugar is less than 70 or consistently above 250  -Treating a low sugar by rule of 15  (15 gms of sugar every 15 min until sugar is more than 70) If you feel your sugar is low, test your sugar to be sure If your sugar is low (less than 70), then take 15 grams of a fast acting Carbohydrate (3-4 glucose tablets or glucose gel or 4 ounces of juice or regular soda) Recheck your sugar 15 min after treating low to make sure it is more than 70 If sugar is still less than 70, treat again with 15 grams of carbohydrate          Don't drive the hour of  hypoglycemia  If unconscious/unable to eat or drink by mouth, use glucagon  injection or nasal spray baqsimi  and call 911. Can repeat again in 15 min if still unconscious.  Return in about 2 months (around 10/06/2024).   I have reviewed current medications, nurse's notes, allergies, vital signs, past medical and surgical history, family medical history, and social history for this encounter. Counseled patient on symptoms, examination findings, lab findings, imaging results, treatment decisions and monitoring and prognosis. The patient understood the recommendations and agrees with the treatment plan. All questions regarding treatment plan were fully answered.  [name], MD  08/06/24    History of Present Illness NATALEY BAHRI is a 18 y.o. year old female who presents for follow up of Type II diabetes mellitus.  LEONDA CRISTO was first diagnosed in 2020.   Diabetes education +  Home diabetes regimen: Stopped Ozempic  0.25mg /week (non-compliant) metformin  XR 500 mg twice a day  Tresiba  36 units qpm Novolog : 20 units with beak fast, 22 units with lunch, 18 units with  supper 15 min before meals  COMPLICATIONS -  MI/Stroke -  retinopathy -  neuropathy -  nephropathy  BLOOD SUGAR DATA CGM interpretation: At today's visit, we reviewed her CGM downloads. The full report is scanned in the media. Reviewing the CGM trends, BG are  High with some overnight lows; about 3 days data last from August, 2025.  Physical Exam  BP 106/80   Pulse 84   Ht 5' 3 (1.6 m)   Wt (!) 361 lb (163.7 kg)   SpO2 96%   BMI 63.95 kg/m    Constitutional: well developed, well nourished Head: normocephalic, atraumatic Eyes: sclera anicteric, no redness Neck: supple Lungs: normal respiratory effort Neurology: alert and oriented Skin: dry, no appreciable rashes Musculoskeletal: no appreciable defects Psychiatric: normal mood and affect Diabetic Foot Exam - Simple   No data filed       Current Medications Patient's Medications  New Prescriptions   BLOOD GLUCOSE MONITORING SUPPL DEVI    1 each by Does not apply route in the morning, at noon, and at bedtime. May substitute to any manufacturer covered by patient's insurance.   GLUCOSE BLOOD (BLOOD GLUCOSE TEST STRIPS) STRP    1 each by In Vitro route in the morning, at noon, and at bedtime. May substitute to any manufacturer covered by patient's insurance.   LANCET DEVICE MISC    1 each by Does not apply route in the morning, at noon, and at bedtime. May substitute to any manufacturer covered by patient's insurance.   LANCETS MISC. MISC    1 each by Does not apply route in the morning, at noon, and at bedtime. May substitute to any manufacturer covered by patient's insurance.  Previous Medications   ACCU-CHEK FASTCLIX LANCETS MISC    USE TO CHECK BLOOD SUGAR SIX TIMES DAILY   ACETONE, URINE, TEST STRIP    Check ketones per protocol   BD PEN NEEDLE MICRO U/F 32G X 6 MM MISC    USE TO INJECT INSULIN  6 TIMES A DAILY   BD PEN NEEDLE NANO 2ND GEN 32G X 4 MM MISC    USE TO INJECT INSULIN  6 TIMES DAILY   CONTINUOUS BLOOD GLUC RECEIVER (DEXCOM G6 RECEIVER) DEVI    Use with Dexcom Sensor and Transmitter to check Blood Sugars   CONTINUOUS BLOOD GLUC TRANSMIT (DEXCOM G6 TRANSMITTER) MISC    Use with Dexcom Sensor, reuse transmitter for 3 months.   CONTINUOUS GLUCOSE SENSOR (DEXCOM G7 SENSOR) MISC    Use 1 sensor as directed every 10 days to monitor glucose continuously.   CONTINUOUS GLUCOSE SENSOR (DEXCOM G7 SENSOR) MISC    1 Device by Does not apply route continuous.   CONTINUOUS GLUCOSE TRANSMITTER (DEXCOM G6 TRANSMITTER) MISC    Use 1 transmitter as directed every 90 days   FLUTICASONE  (FLONASE ) 50 MCG/ACT NASAL SPRAY    Place 1 spray into both nostrils daily.   GLUCAGON  (BAQSIMI  ONE PACK) 3 MG/DOSE POWD    Place 1 Device into the nose as needed (Low blood sugar with impaired consciousness).   GLUCAGON  (BAQSIMI  TWO PACK) 3 MG/DOSE POWD     Place 1 each into the nose as needed (severe hypoglycmia with unresponsiveness).   GLUCOSE BLOOD (ACCU-CHEK GUIDE) TEST STRIP    USE TO CHECK BLOOD SUGAR SIX TIMES DAILY   GLUCOSE BLOOD (IGLUCOSE TEST STRIPS VI)       INSULIN  ASPART (NOVOLOG ) 100 UNIT/ML INJECTION    Inject up to 200 units into insulin  pump as directed  every 2-3 days. Please fill for VIAL.   INSULIN  ASPART FLEXPEN (NOVOLOG ) 100 UNIT/ML    Inject 22 units before break fast, 20 units before lunch and 18 units before supper 15 min before meals   INSULIN  DEGLUDEC (TRESIBA  FLEXTOUCH) 100 UNIT/ML FLEXTOUCH PEN    Inject 36 Units into the skin daily.   INSULIN  PEN NEEDLE (PEN NEEDLES) 32G X 4 MM MISC    1 Device by Does not apply route in the morning, at noon, in the evening, and at bedtime.   LANCETS MISC. (ACCU-CHEK FASTCLIX LANCET) KIT    Check sugar 6 times daily   LISINOPRIL (ZESTRIL) 20 MG TABLET    Take by mouth.   LORATADINE  (CLARITIN ) 10 MG TABLET    Take 1 tablet (10 mg total) by mouth daily.   METFORMIN  (GLUCOPHAGE -XR) 500 MG 24 HR TABLET    TAKE 1 TABLET(500 MG) BY MOUTH TWICE DAILY WITH A MEAL   MONTELUKAST  (SINGULAIR ) 4 MG CHEWABLE TABLET    Chew 1 tablet (4 mg total) by mouth at bedtime.   NAPROXEN  (NAPROSYN ) 500 MG TABLET    Take 1 tablet (500 mg total) by mouth 2 (two) times daily with a meal.   OSTOMY SUPPLIES (SKIN TAC ADHESIVE BARRIER WIPE) MISC    For use with Dexcom   SEMAGLUTIDE ,0.25 OR 0.5MG /DOS, (OZEMPIC , 0.25 OR 0.5 MG/DOSE,) 2 MG/3ML SOPN    Inject 0.5 mg into the skin once a week.  Modified Medications   No medications on file  Discontinued Medications   No medications on file    Allergies No Known Allergies  Past Medical History Past Medical History:  Diagnosis Date   Abnormal food appetite 02/21/2017   Allergic rhinitis 09/05/2013   Elevated transaminase level 03/04/2020   Hypertension    Obesity, unspecified 09/05/2013   Prediabetes 03/27/2014   Hgb A1C 5.9%     Unspecified asthma(493.90)  09/05/2013    Past Surgical History Past Surgical History:  Procedure Laterality Date   TONSILLECTOMY     TYMPANOSTOMY TUBE PLACEMENT      Family History family history includes Asthma in her sister; Bronchitis in her sister; Diabetes in her mother, paternal grandfather, and paternal grandmother; Heart disease in her paternal grandfather; Hypertension in her mother, paternal grandfather, and paternal grandmother; Obesity in her paternal aunt, paternal grandfather, and paternal grandmother.  Social History Social History   Socioeconomic History   Marital status: Single    Spouse name: Not on file   Number of children: Not on file   Years of education: Not on file   Highest education level: Not on file  Occupational History   Not on file  Tobacco Use   Smoking status: Never    Passive exposure: Yes   Smokeless tobacco: Never   Tobacco comments:    mom smokes  Vaping Use   Vaping status: Never Used  Substance and Sexual Activity   Alcohol use: Never   Drug use: Not Currently    Types: Marijuana    Comment: has tried once in the past   Sexual activity: Not Currently    Birth control/protection: None    Comment: lost her virginity when she was 10 per pt  Other Topics Concern   Not on file  Social History Narrative   Lives with dad, PGF and PGM and Paternal Aunt, brother age 33 year old   Goes to [place] on weekends. Sisters 3 and 2 live with mom      2018 lives with  mom and stepdad,and siblings  mom smokes   Visits dad on weekends.      She is in 9th grade       she gets A's and B's. She enjoys playing outside, listening to music, and drawing.       2021: stays with grandmother most of the time, but also sometimes stays with mom      2022: 10th grade at [employer].       2024[employer] 12th grade 23-24 school year   Social Drivers of Health   Financial Resource Strain: Not on file  Food Insecurity: Not on file  Transportation  Needs: Not on file  Physical Activity: Not on file  Stress: Not on file  Social Connections: Not on file  Intimate Partner Violence: Not on file    Lab Results  Component Value Date   HGBA1C 11.7 (A) 03/24/2024   HGBA1C 9.4 05/07/2023   HGBA1C 8.7 (A) 08/15/2021   Lab Results  Component Value Date   CHOL 219 (H) 03/24/2024   Lab Results  Component Value Date   HDL 47 03/24/2024   Lab Results  Component Value Date   LDLCALC 156 (H) 03/24/2024   Lab Results  Component Value Date   TRIG 67 03/24/2024   Lab Results  Component Value Date   CHOLHDL 4.7 03/24/2024   Lab Results  Component Value Date   CREATININE 0.76 03/24/2024   No results found for: GFR Lab Results  Component Value Date   MICROALBUR 0.2 03/24/2024      Component Value Date/Time   NA 136 03/24/2024 0934   K 4.7 03/24/2024 0934   CL 100 03/24/2024 0934   CO2 28 03/24/2024 0934   GLUCOSE 286 (H) 03/24/2024 0934   BUN 17 03/24/2024 0934   CREATININE 0.76 03/24/2024 0934   CALCIUM  9.1 03/24/2024 0934   PROT 6.7 03/24/2024 0934   ALBUMIN 4.1 03/24/2014 0912   AST 13 03/24/2024 0934   ALT 18 03/24/2024 0934   ALKPHOS 205 03/24/2014 0912   BILITOT 0.2 03/24/2024 0934   GFRNONAA NOT CALCULATED 03/07/2020 0451   GFRAA NOT CALCULATED 03/07/2020 0451      Latest Ref Rng & Units 03/24/2024    9:34 AM 04/02/2023   11:27 AM 03/07/2020    4:51 AM  BMP  Glucose 65 - 99 mg/dL 713  707  803   BUN 7 - 20 mg/dL 17  12  6    Creatinine 0.50 - 0.96 mg/dL 9.23  9.21  9.44   BUN/Creat Ratio 6 - 22 (calc) SEE NOTE:  SEE NOTE:    Sodium 135 - 146 mmol/L 136  137  138   Potassium 3.8 - 5.1 mmol/L 4.7  5.3  3.4   Chloride 98 - 110 mmol/L 100  102  108   CO2 20 - 32 mmol/L 28  27  20    Calcium  8.9 - 10.4 mg/dL 9.1  9.2  8.5        Component Value Date/Time   WBC 12.8 03/02/2020 2214   RBC 5.16 03/02/2020 2214   HGB 14.7 (A) 03/25/2020 1529   HGB 14.3 03/04/2020 2244   HGB 13.5 06/17/2019 1602   HCT 42.0  03/04/2020 2244   HCT 39.8 06/17/2019 1602   PLT 397 03/02/2020 2214   PLT 416 06/17/2019 1602   MCV 85.5 03/02/2020 2214   MCV 85 06/17/2019 1602   MCH 28.1 03/02/2020 2214   MCHC 32.9  03/02/2020 2214   RDW 12.4 03/02/2020 2214   RDW 13.1 06/17/2019 1602   LYMPHSABS 3.7 03/02/2020 2214   LYMPHSABS 2.9 06/17/2019 1602   MONOABS 0.8 03/02/2020 2214   EOSABS 0.1 03/02/2020 2214   EOSABS 0.2 06/17/2019 1602   BASOSABS 0.1 03/02/2020 2214   BASOSABS 0.0 06/17/2019 1602     Parts of this note may have been dictated using voice recognition software. There may be variances in spelling and vocabulary which are unintentional. Not all errors are proofread. Please notify the dino if any discrepancies are noted or if the meaning of any statement is not clear.

## 2024-08-06 NOTE — Patient Instructions (Addendum)
 Will recommend the following: Ozempic  0.25mg  /week -> 0.5 mg weekly after 4 weeks  Metformin  XR 500 mg twice a day with meals if well tolerated after 1 week, increase to 1 pill in morning and two at night->after 2 weeks, switch to 2 pills in morning and two at night Tresiba  36 units daily. Increase by 1 unit every day until fasting blood sugar is less than 120. Stay on that dose.   Novolog : 22 units with beak fast, 20 units with lunch, 18 units with supper 15 min before meals ___________   Goals of DM therapy:  Morning Fasting blood sugar: 80-140  Blood sugar before meals: 80-140 Bed time blood sugar: 100-150  A1C <7%, limited only by hypoglycemia  1.Diabetes medications and their side effects discussed, including hypoglycemia    2. Check blood glucose:  a) Always check blood sugars before driving. Please see below (under hypoglycemia) on how to manage b) Check a minimum of 3 times/day or more as needed when having symptoms of hypoglycemia.   c) Try to check blood glucose before sleeping/in the middle of the night to ensure that it is remaining stable and not dropping less than 100 d) Check blood glucose more often if sick  3. Diet: a) 3 meals per day schedule b: Restrict carbs to 60-70 grams (4 servings) per meal c) Colorful vegetables - 3 servings a day, and low sugar fruit 2 servings/day Plate control method: 1/4 plate protein, 1/4 starch, 1/2 green, yellow, or red vegetables d) Avoid carbohydrate snacks unless hypoglycemic episode, or increased physical activity  4. Regular exercise as tolerated, preferably 3 or more hours a week  5. Hypoglycemia: a)  Do not drive or operate machinery without first testing blood glucose to assure it is over 90 mg%, or if dizzy, lightheaded, not feeling normal, etc, or  if foot or leg is numb or weak. b)  If blood glucose less than 70, take four 5gm Glucose tabs or 15-30 gm Glucose gel.  Repeat every 15 min as needed until blood sugar is >100  mg/dl. If hypoglycemia persists then call 911.   6. Sick day management: a) Check blood glucose more often b) Continue usual therapy if blood sugars are elevated.   7. Contact the doctor immediately if blood glucose is frequently <60 mg/dl, or an episode of severe hypoglycemia occurs (where someone had to give you glucose/  glucagon  or if you passed out from a low blood glucose), or if blood glucose is persistently >350 mg/dl, for further management  8. A change in level of physical activity or exercise and a change in diet may also affect your blood sugar. Check blood sugars more often and call if needed.  Instructions: 1. Bring glucose meter, blood glucose records on every visit for review 2. Continue to follow up with primary care physician and other providers for medical care 3. Yearly eye  and foot exam 4. Please get blood work done prior to the next appointment

## 2024-08-08 ENCOUNTER — Encounter: Payer: Self-pay | Admitting: "Endocrinology

## 2024-08-15 ENCOUNTER — Encounter: Payer: Self-pay | Admitting: *Deleted

## 2024-09-01 ENCOUNTER — Other Ambulatory Visit: Payer: Self-pay | Admitting: Physician Assistant

## 2024-09-01 DIAGNOSIS — E282 Polycystic ovarian syndrome: Secondary | ICD-10-CM

## 2024-09-08 ENCOUNTER — Other Ambulatory Visit: Payer: Self-pay | Admitting: "Endocrinology

## 2024-09-29 ENCOUNTER — Encounter: Payer: Self-pay | Admitting: Radiology

## 2024-10-06 ENCOUNTER — Ambulatory Visit
Admission: RE | Admit: 2024-10-06 | Discharge: 2024-10-06 | Disposition: A | Discharge status: Home / Self Care | Source: Ambulatory Visit | Attending: Physician Assistant | Admitting: Physician Assistant

## 2024-10-06 DIAGNOSIS — E282 Polycystic ovarian syndrome: Secondary | ICD-10-CM

## 2024-10-13 ENCOUNTER — Ambulatory Visit: Admitting: "Endocrinology

## 2024-11-05 ENCOUNTER — Other Ambulatory Visit (HOSPITAL_COMMUNITY): Payer: Self-pay

## 2024-11-05 ENCOUNTER — Telehealth: Payer: Self-pay

## 2024-11-17 NOTE — Telephone Encounter (Signed)
 error

## 2024-12-03 ENCOUNTER — Ambulatory Visit: Admitting: "Endocrinology

## 2025-01-05 ENCOUNTER — Encounter: Admitting: Adult Health
